# Patient Record
Sex: Female | Born: 1970 | Race: White | Hispanic: No | Marital: Married | Smoking: Former smoker
Health system: Southern US, Community
[De-identification: ages and names within clinical notes are randomized; demographics above are authoritative.]

## PROBLEM LIST (undated history)

## (undated) DIAGNOSIS — E039 Hypothyroidism, unspecified: Secondary | ICD-10-CM

## (undated) DIAGNOSIS — F329 Major depressive disorder, single episode, unspecified: Secondary | ICD-10-CM

## (undated) DIAGNOSIS — M1990 Unspecified osteoarthritis, unspecified site: Secondary | ICD-10-CM

## (undated) DIAGNOSIS — I10 Essential (primary) hypertension: Secondary | ICD-10-CM

## (undated) DIAGNOSIS — F419 Anxiety disorder, unspecified: Secondary | ICD-10-CM

## (undated) DIAGNOSIS — M6289 Other specified disorders of muscle: Secondary | ICD-10-CM

## (undated) DIAGNOSIS — S92343A Displaced fracture of fourth metatarsal bone, unspecified foot, initial encounter for closed fracture: Secondary | ICD-10-CM

## (undated) DIAGNOSIS — A549 Gonococcal infection, unspecified: Secondary | ICD-10-CM

## (undated) DIAGNOSIS — F32A Depression, unspecified: Secondary | ICD-10-CM

## (undated) DIAGNOSIS — S92353A Displaced fracture of fifth metatarsal bone, unspecified foot, initial encounter for closed fracture: Secondary | ICD-10-CM

## (undated) DIAGNOSIS — Z6841 Body Mass Index (BMI) 40.0 and over, adult: Secondary | ICD-10-CM

## (undated) DIAGNOSIS — E282 Polycystic ovarian syndrome: Secondary | ICD-10-CM

## (undated) HISTORY — DX: Displaced fracture of fourth metatarsal bone, unspecified foot, initial encounter for closed fracture: S92.343A

## (undated) HISTORY — DX: Polycystic ovarian syndrome: E28.2

## (undated) HISTORY — PX: WISDOM TOOTH EXTRACTION: SHX21

## (undated) HISTORY — DX: Gonococcal infection, unspecified: A54.9

## (undated) HISTORY — DX: Other specified disorders of muscle: M62.89

## (undated) HISTORY — DX: Body Mass Index (BMI) 40.0 and over, adult: Z684

## (undated) HISTORY — DX: Morbid (severe) obesity due to excess calories: E66.01

## (undated) HISTORY — DX: Displaced fracture of fifth metatarsal bone, unspecified foot, initial encounter for closed fracture: S92.353A

---
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
## 1998-04-19 HISTORY — PX: DILATION AND CURETTAGE OF UTERUS: SHX78

## 2004-02-04 ENCOUNTER — Ambulatory Visit: Payer: Self-pay | Admitting: Family Medicine

## 2004-02-27 ENCOUNTER — Emergency Department: Payer: Self-pay | Admitting: Emergency Medicine

## 2005-10-31 ENCOUNTER — Emergency Department: Payer: Self-pay | Admitting: Emergency Medicine

## 2005-11-19 ENCOUNTER — Ambulatory Visit: Payer: Self-pay

## 2009-01-17 HISTORY — PX: BUNIONECTOMY: SHX129

## 2009-12-11 ENCOUNTER — Emergency Department: Payer: Self-pay | Admitting: Emergency Medicine

## 2012-05-08 ENCOUNTER — Ambulatory Visit: Payer: Self-pay | Admitting: Family Medicine

## 2013-10-24 ENCOUNTER — Emergency Department: Payer: Self-pay | Admitting: Emergency Medicine

## 2013-11-17 DIAGNOSIS — A549 Gonococcal infection, unspecified: Secondary | ICD-10-CM

## 2013-11-17 HISTORY — DX: Gonococcal infection, unspecified: A54.9

## 2015-04-30 ENCOUNTER — Ambulatory Visit
Admission: RE | Admit: 2015-04-30 | Discharge: 2015-04-30 | Disposition: A | Discharge status: Home / Self Care | Payer: BLUE CROSS/BLUE SHIELD | Source: Ambulatory Visit | Attending: Family Medicine | Admitting: Family Medicine

## 2015-04-30 ENCOUNTER — Other Ambulatory Visit: Payer: Self-pay | Admitting: Family Medicine

## 2015-04-30 DIAGNOSIS — M545 Low back pain: Secondary | ICD-10-CM | POA: Insufficient documentation

## 2015-04-30 DIAGNOSIS — M4690 Unspecified inflammatory spondylopathy, site unspecified: Secondary | ICD-10-CM

## 2015-04-30 DIAGNOSIS — R938 Abnormal findings on diagnostic imaging of other specified body structures: Secondary | ICD-10-CM | POA: Diagnosis not present

## 2015-05-08 ENCOUNTER — Other Ambulatory Visit: Payer: Self-pay | Admitting: Family Medicine

## 2015-08-20 ENCOUNTER — Encounter
Admission: RE | Admit: 2015-08-20 | Discharge: 2015-08-20 | Disposition: A | Discharge status: Home / Self Care | Payer: BLUE CROSS/BLUE SHIELD | Source: Ambulatory Visit | Attending: Obstetrics & Gynecology | Admitting: Obstetrics & Gynecology

## 2015-08-20 DIAGNOSIS — Z01812 Encounter for preprocedural laboratory examination: Secondary | ICD-10-CM | POA: Insufficient documentation

## 2015-08-20 HISTORY — DX: Major depressive disorder, single episode, unspecified: F32.9

## 2015-08-20 HISTORY — DX: Depression, unspecified: F32.A

## 2015-08-20 HISTORY — DX: Hypothyroidism, unspecified: E03.9

## 2015-08-20 HISTORY — DX: Essential (primary) hypertension: I10

## 2015-08-20 HISTORY — DX: Anxiety disorder, unspecified: F41.9

## 2015-08-20 HISTORY — DX: Unspecified osteoarthritis, unspecified site: M19.90

## 2015-08-20 LAB — CBC
HCT: 38.1 % (ref 35.0–47.0)
Hemoglobin: 12.7 g/dL (ref 12.0–16.0)
MCH: 27.7 pg (ref 26.0–34.0)
MCHC: 33.4 g/dL (ref 32.0–36.0)
MCV: 82.9 fL (ref 80.0–100.0)
PLATELETS: 254 10*3/uL (ref 150–440)
RBC: 4.6 MIL/uL (ref 3.80–5.20)
RDW: 15.8 % — ABNORMAL HIGH (ref 11.5–14.5)
WBC: 10.6 10*3/uL (ref 3.6–11.0)

## 2015-08-20 LAB — TYPE AND SCREEN
ABO/RH(D): A POS
ANTIBODY SCREEN: NEGATIVE

## 2015-08-20 LAB — ABO/RH: ABO/RH(D): A POS

## 2015-08-20 NOTE — Patient Instructions (Signed)
Your procedure is scheduled on: Friday 512/17 Report to Day Surgery. 2ND FLOOR MEDICAL MALL ENTRANCE To find out your arrival time please call [phone] between 1PM - 3PM on Thursday 08/28/15.  Remember: Instructions that are not followed completely may result in serious medical risk, up to and including death, or upon the discretion of your surgeon and anesthesiologist your surgery may need to be rescheduled.    __X__ 1. Do not eat food or drink liquids after midnight. No gum chewing or hard candies.     __X__ 2. No Alcohol for 24 hours before or after surgery.   ____ 3. Bring all medications with you on the day of surgery if instructed.    __X__ 4. Notify your doctor if there is any change in your medical condition     (cold, fever, infections).     Do not wear jewelry, make-up, hairpins, clips or nail polish.  Do not wear lotions, powders, or perfumes.   Do not shave 48 hours prior to surgery. Men may shave face and neck.  Do not bring valuables to the hospital.    [facility] any belongings or valuables.               Contacts, dentures or bridgework may not be worn into surgery.  Leave your suitcase in the car. After surgery it may be brought to your room.  For patients admitted to the hospital, discharge time is determined by your                treatment team.   Patients discharged the day of surgery will not be allowed to drive home.   Please read over the following fact sheets that you were given:   Surgical Site Infection Prevention   __X__ Take these medicines the morning of surgery with A SIP OF WATER:    1. CELEXA  2.   3.   4.  5.  6.  ____ Fleet Enema (as directed)   ____ Use CHG Soap as directed  ____ Use inhalers on the day of surgery  ____ Stop metformin 2 days prior to surgery    ____ Take 1/2 of usual insulin dose the night before surgery and none on the morning of surgery.   ____ Stop Coumadin/Plavix/aspirin on   ____  Stop Anti-inflammatories on    __X__ Stop supplements until after surgery.  HOLD B COMPLEX UNTIL AFTER SURGERY STARTING FRIDAY  __X__ Bring C-Pap to the hospital.

## 2015-08-20 NOTE — Pre-Procedure Instructions (Signed)
ANESTHESIA - ECG INTERPRETATION   Component Name Value Range  Vent Rate (bpm) 78   PR Interval (msec) 150   QRS Interval (msec) 106   QT Interval (msec) 456   QTc (msec) 519    Result Narrative  Normal sinus rhythm RSR' in V1 or V2 Nonspecific T wave abnormalities Abnormal ECG No previous ECGs available I reviewed and concur with this report. Electronically signed AV:[id number]:ROSENBERG, MD, Renae FicklePAUL [phone]) on 12/06/2014 7:40:15 PM   Status Results Details

## 2015-08-29 ENCOUNTER — Encounter: Payer: Self-pay | Admitting: *Deleted

## 2015-08-29 ENCOUNTER — Encounter
Admission: RE | Disposition: A | Discharge status: Home / Self Care | Payer: Self-pay | Source: Ambulatory Visit | Attending: Obstetrics & Gynecology

## 2015-08-29 ENCOUNTER — Ambulatory Visit: Payer: BLUE CROSS/BLUE SHIELD | Admitting: Anesthesiology

## 2015-08-29 ENCOUNTER — Ambulatory Visit
Admission: RE | Admit: 2015-08-29 | Discharge: 2015-08-29 | Disposition: A | Discharge status: Home / Self Care | Payer: BLUE CROSS/BLUE SHIELD | Source: Ambulatory Visit | Attending: Obstetrics & Gynecology | Admitting: Obstetrics & Gynecology

## 2015-08-29 DIAGNOSIS — E039 Hypothyroidism, unspecified: Secondary | ICD-10-CM | POA: Diagnosis not present

## 2015-08-29 DIAGNOSIS — Z87891 Personal history of nicotine dependence: Secondary | ICD-10-CM | POA: Insufficient documentation

## 2015-08-29 DIAGNOSIS — Z9109 Other allergy status, other than to drugs and biological substances: Secondary | ICD-10-CM | POA: Diagnosis not present

## 2015-08-29 DIAGNOSIS — Z6841 Body Mass Index (BMI) 40.0 and over, adult: Secondary | ICD-10-CM | POA: Insufficient documentation

## 2015-08-29 DIAGNOSIS — M1990 Unspecified osteoarthritis, unspecified site: Secondary | ICD-10-CM | POA: Diagnosis not present

## 2015-08-29 DIAGNOSIS — F329 Major depressive disorder, single episode, unspecified: Secondary | ICD-10-CM | POA: Diagnosis not present

## 2015-08-29 DIAGNOSIS — N921 Excessive and frequent menstruation with irregular cycle: Secondary | ICD-10-CM | POA: Diagnosis present

## 2015-08-29 DIAGNOSIS — Z8042 Family history of malignant neoplasm of prostate: Secondary | ICD-10-CM | POA: Diagnosis not present

## 2015-08-29 DIAGNOSIS — N926 Irregular menstruation, unspecified: Secondary | ICD-10-CM | POA: Diagnosis not present

## 2015-08-29 DIAGNOSIS — Z8249 Family history of ischemic heart disease and other diseases of the circulatory system: Secondary | ICD-10-CM | POA: Insufficient documentation

## 2015-08-29 DIAGNOSIS — Z79899 Other long term (current) drug therapy: Secondary | ICD-10-CM | POA: Insufficient documentation

## 2015-08-29 DIAGNOSIS — N915 Oligomenorrhea, unspecified: Secondary | ICD-10-CM | POA: Diagnosis not present

## 2015-08-29 DIAGNOSIS — Z820 Family history of epilepsy and other diseases of the nervous system: Secondary | ICD-10-CM | POA: Diagnosis not present

## 2015-08-29 DIAGNOSIS — F419 Anxiety disorder, unspecified: Secondary | ICD-10-CM | POA: Insufficient documentation

## 2015-08-29 DIAGNOSIS — Z833 Family history of diabetes mellitus: Secondary | ICD-10-CM | POA: Insufficient documentation

## 2015-08-29 DIAGNOSIS — Z888 Allergy status to other drugs, medicaments and biological substances status: Secondary | ICD-10-CM | POA: Diagnosis not present

## 2015-08-29 HISTORY — PX: HYSTEROSCOPY WITH D & C: SHX1775

## 2015-08-29 LAB — POCT PREGNANCY, URINE: Preg Test, Ur: NEGATIVE

## 2015-08-29 SURGERY — DILATATION AND CURETTAGE /HYSTEROSCOPY
Anesthesia: General | Wound class: Clean Contaminated

## 2015-08-29 MED ORDER — KETOROLAC TROMETHAMINE 30 MG/ML IJ SOLN: 30.0000 mg | Freq: Four times a day (QID) | INTRAMUSCULAR | Status: DC

## 2015-08-29 MED ORDER — ONDANSETRON HCL 4 MG/2ML IJ SOLN
INTRAMUSCULAR | Status: DC | PRN
  Administered 2015-08-29: 4 mg via INTRAVENOUS

## 2015-08-29 MED ORDER — FENTANYL CITRATE (PF) 100 MCG/2ML IJ SOLN: 25.0000 ug | INTRAMUSCULAR | Status: DC | PRN

## 2015-08-29 MED ORDER — ONDANSETRON HCL 4 MG/2ML IJ SOLN: 4.0000 mg | Freq: Once | INTRAMUSCULAR | Status: DC | PRN

## 2015-08-29 MED ORDER — ACETAMINOPHEN 650 MG RE SUPP: 650.0000 mg | RECTAL | Status: DC | PRN

## 2015-08-29 MED ORDER — MORPHINE SULFATE (PF) 2 MG/ML IV SOLN: 1.0000 mg | INTRAVENOUS | Status: DC | PRN

## 2015-08-29 MED ORDER — PROPOFOL 10 MG/ML IV BOLUS
INTRAVENOUS | Status: DC | PRN
  Administered 2015-08-29: 200 mg via INTRAVENOUS

## 2015-08-29 MED ORDER — SILVER NITRATE-POT NITRATE 75-25 % EX MISC
CUTANEOUS | Status: AC
  Filled 2015-08-29: qty 2

## 2015-08-29 MED ORDER — FAMOTIDINE 20 MG PO TABS
ORAL_TABLET | ORAL | Status: AC
  Administered 2015-08-29: 20 mg via ORAL
  Filled 2015-08-29: qty 1

## 2015-08-29 MED ORDER — FENTANYL CITRATE (PF) 100 MCG/2ML IJ SOLN
INTRAMUSCULAR | Status: DC | PRN
  Administered 2015-08-29: 25 ug via INTRAVENOUS
  Administered 2015-08-29: 50 ug via INTRAVENOUS
  Administered 2015-08-29: 25 ug via INTRAVENOUS

## 2015-08-29 MED ORDER — ACETAMINOPHEN 325 MG PO TABS: 650.0000 mg | ORAL_TABLET | ORAL | Status: DC | PRN

## 2015-08-29 MED ORDER — OXYCODONE-ACETAMINOPHEN 5-325 MG PO TABS: 1.0000 | ORAL_TABLET | ORAL | Status: DC | PRN

## 2015-08-29 MED ORDER — KETOROLAC TROMETHAMINE 30 MG/ML IJ SOLN
INTRAMUSCULAR | Status: DC | PRN
  Administered 2015-08-29: 30 mg via INTRAVENOUS

## 2015-08-29 MED ORDER — LACTATED RINGERS IR SOLN
Status: DC | PRN
  Administered 2015-08-29: 700 mL

## 2015-08-29 MED ORDER — SILVER NITRATE-POT NITRATE 75-25 % EX MISC
CUTANEOUS | Status: AC
  Filled 2015-08-29: qty 1

## 2015-08-29 MED ORDER — FAMOTIDINE 20 MG PO TABS
20.0000 mg | ORAL_TABLET | Freq: Once | ORAL | Status: AC
  Administered 2015-08-29: 20 mg via ORAL

## 2015-08-29 MED ORDER — LACTATED RINGERS IV SOLN
INTRAVENOUS | Status: DC
  Administered 2015-08-29: 10:00:00 via INTRAVENOUS

## 2015-08-29 SURGICAL SUPPLY — 24 items
ABLATOR ENDOMETRIAL MYOSURE (ABLATOR) IMPLANT
BAG COUNTER SPONGE EZ (MISCELLANEOUS) IMPLANT
CANISTER SUC SOCK COL 7IN (MISCELLANEOUS) ×3 IMPLANT
CATH ROBINSON RED A/P 16FR (CATHETERS) ×3 IMPLANT
COUNTER SPONGE BAG EZ (MISCELLANEOUS)
ELECT REM PT RETURN 9FT ADLT (ELECTROSURGICAL)
ELECTRODE REM PT RTRN 9FT ADLT (ELECTROSURGICAL) IMPLANT
GLOVE BIO SURGEON STRL SZ8 (GLOVE) ×6 IMPLANT
GOWN STRL REUS W/ TWL LRG LVL3 (GOWN DISPOSABLE) ×1 IMPLANT
GOWN STRL REUS W/ TWL XL LVL3 (GOWN DISPOSABLE) ×1 IMPLANT
GOWN STRL REUS W/TWL LRG LVL3 (GOWN DISPOSABLE) ×2
GOWN STRL REUS W/TWL XL LVL3 (GOWN DISPOSABLE) ×2
IV LACTATED RINGERS 1000ML (IV SOLUTION) ×3 IMPLANT
MYOSURE LITE POLYP REMOVAL (MISCELLANEOUS) IMPLANT
PACK DNC HYST (MISCELLANEOUS) ×3 IMPLANT
PAD OB MATERNITY 4.3X12.25 (PERSONAL CARE ITEMS) ×3 IMPLANT
PAD PREP 24X41 OB/GYN DISP (PERSONAL CARE ITEMS) ×3 IMPLANT
SOL .9 NS 3000ML IRR  AL (IV SOLUTION)
SOL .9 NS 3000ML IRR UROMATIC (IV SOLUTION) IMPLANT
STRAP SAFETY BODY (MISCELLANEOUS) ×3 IMPLANT
TOWEL OR 17X26 4PK STRL BLUE (TOWEL DISPOSABLE) ×3 IMPLANT
TUBING CONNECTING 10 (TUBING) IMPLANT
TUBING CONNECTING 10' (TUBING)
TUBING HYSTEROSCOPY DOLPHIN (MISCELLANEOUS) IMPLANT

## 2015-08-29 NOTE — Anesthesia Preprocedure Evaluation (Addendum)
Anesthesia Evaluation  Patient identified by MRN, date of birth, ID band Patient awake    Reviewed: Allergy & Precautions, H&P , NPO status , Patient's Chart, lab work & pertinent test results, reviewed documented beta blocker date and time   Airway Mallampati: II  TM Distance: >3 FB Neck ROM: full    Dental  (+) Teeth Intact   Pulmonary neg pulmonary ROS, former smoker,    Pulmonary exam normal        Cardiovascular Exercise Tolerance: Good hypertension, negative cardio ROS Normal cardiovascular exam Rate:Normal     Neuro/Psych negative neurological ROS  negative psych ROS   GI/Hepatic negative GI ROS, Neg liver ROS,   Endo/Other  negative endocrine ROSHypothyroidism Morbid obesity  Renal/GU negative Renal ROS  negative genitourinary   Musculoskeletal  (+) Arthritis ,   Abdominal   Peds  Hematology negative hematology ROS (+)   Anesthesia Other Findings   Reproductive/Obstetrics negative OB ROS                            Anesthesia Physical Anesthesia Plan  ASA: II  Anesthesia Plan: General LMA   Post-op Pain Management:    Induction:   Airway Management Planned:   Additional Equipment:   Intra-op Plan:   Post-operative Plan:   Informed Consent: I have reviewed the patients History and Physical, chart, labs and discussed the procedure including the risks, benefits and alternatives for the proposed anesthesia with the patient or authorized representative who has indicated his/her understanding and acceptance.     Plan Discussed with: CRNA  Anesthesia Plan Comments:         Anesthesia Quick Evaluation

## 2015-08-29 NOTE — Transfer of Care (Signed)
Immediate Anesthesia Transfer of Care Note  Patient: Sarah Lozano  Procedure(s) Performed: Procedure(s): DILATATION AND CURETTAGE /HYSTEROSCOPY (N/A)  Patient Location: PACU  Anesthesia Type:General  Level of Consciousness: patient cooperative and lethargic  Airway & Oxygen Therapy: Patient Spontanous Breathing and Patient connected to face mask oxygen  Post-op Assessment: Report given to RN and Post -op Vital signs reviewed and stable  Post vital signs: Reviewed and stable  Last Vitals:  Filed Vitals:   08/29/15 0926  BP: 144/89  Pulse: 77  Temp: 36.8 C  Resp: 20    Last Pain: There were no vitals filed for this visit.       Complications: No apparent anesthesia complications

## 2015-08-29 NOTE — Anesthesia Procedure Notes (Signed)
Procedure Name: LMA Insertion Date/Time: 08/29/2015 10:25 AM Performed by: Omer JackWEATHERLY, Sarah Rosier Pre-anesthesia Checklist: Patient identified, Patient being monitored, Timeout performed, Emergency Drugs available and Suction available Patient Re-evaluated:Patient Re-evaluated prior to inductionOxygen Delivery Method: Circle system utilized Preoxygenation: Pre-oxygenation with 100% oxygen Intubation Type: IV induction Ventilation: Mask ventilation without difficulty LMA: LMA inserted LMA Size: 3.5 Tube type: Oral Number of attempts: 1 Placement Confirmation: positive ETCO2 and breath sounds checked- equal and bilateral Tube secured with: Tape Dental Injury: Teeth and Oropharynx as per pre-operative assessment

## 2015-08-29 NOTE — Op Note (Signed)
Operative Note  08/29/2015  PRE-OP DIAGNOSIS: Irregular Menstrual Bleeding, Oligomenorrhea, Morbid obesity, BMI 50-59  POST-OP DIAGNOSIS: same   SURGEON: Annamarie MajorPaul Harris, MD, FACOG  PROCEDURE: Procedure(s): DILATATION AND CURETTAGE /HYSTEROSCOPY   ANESTHESIA: Choice   ESTIMATED BLOOD LOSS: Min, <20 mL   SPECIMENS:  ECC, EMC  FLUID DEFICIT: Min.  COMPLICATIONS: None  DISPOSITION: PACU - hemodynamically stable.  CONDITION: stable  FINDINGS: Exam under anesthesia revealed small, mobile  uterus with no masses and bilateral adnexa without masses or fullness. Hysteroscopy revealed a  grossly normal appearing uterine cavity with bilateral tubal ostia and normal appearing endocervical canal. No polyps or fibroids.  PROCEDURE IN DETAIL: After informed consent was obtained, the patient was taken to the operating room where anesthesia was obtained without difficulty. The patient was positioned in the dorsal lithotomy position in [place] stirrups. The patient's bladder was catheterized with an in and out foley catheter. The patient was examined under anesthesia, with the above noted findings. The weighted speculum was placed inside the patient's vagina, and the the anterior lip of the cervix was seen and grasped with the tenaculum.  An Endocervical specimen was obtained with a kevorkian curette. The uterine cavity was sounded to 8cm, and then the cervix was progressively dilated to a 20French-Pratt dilator. The 30 degree hysteroscope was introduced, with saline fluid used to distend the intrauterine cavity, with the above noted findings.  The hystersocope was removed and the uterine cavity was curetted until a gritty texture was noted, yielding endometrial curettings. Excellent hemostasis was noted, and all instruments were removed, with excellent hemostasis noted throughout. She was then taken out of dorsal lithotomy. Minimal discrepancy in fluid was noted.  The patient tolerated the procedure well.  Sponge, lap and needle counts were correct x2. The patient was taken to recovery room in excellent condition.

## 2015-08-29 NOTE — H&P (Signed)
History and Physical Interval Note:  08/29/2015 9:41 AM  Sarah MondayBrenda J Lozano  has presented today for surgery, with the diagnosis of IRREGULAR UTERINE BLEEDING  The various methods of treatment have been discussed with the patient and family. After consideration of risks, benefits and other options for treatment, the patient has consented to  Procedure(s): DILATATION AND CURETTAGE /HYSTEROSCOPY (N/A) as a surgical intervention .  The patient's history has been reviewed, patient examined, no change in status, stable for surgery.  Pt has the following beta blocker history-  Not taking Beta Blocker.  I have reviewed the patient's chart and labs.  Questions were answered to the patient's satisfaction.       [name]

## 2015-08-29 NOTE — Discharge Instructions (Signed)
Hysteroscopy, Care After  Refer to this sheet in the next few weeks. These instructions provide you with information on caring for yourself after your procedure. Your health care provider may also give you more specific instructions. Your treatment has been planned according to current medical practices, but problems sometimes occur. Call your health care provider if you have any problems or questions after your procedure.   WHAT TO EXPECT AFTER THE PROCEDURE  After your procedure, it is typical to have the following:  · You may have some cramping. This normally lasts for a couple days.  · You may have bleeding. This can vary from light spotting for a few days to menstrual-like bleeding for 3-7 days.  HOME CARE INSTRUCTIONS  · Rest for the first 1-2 days after the procedure.  · Only take over-the-counter or prescription medicines as directed by your health care provider. Do not take aspirin. It can increase the chances of bleeding.  · Take showers instead of baths for 2 weeks or as directed by your health care provider.  · Do not drive for 24 hours or as directed.  · Do not drink alcohol while taking pain medicine.  · Do not use tampons, douche, or have sexual intercourse for 2 weeks or until your health care provider says it is okay.  · Take your temperature twice a day for 4-5 days. Write it down each time.  · Follow your health care provider's advice about diet, exercise, and lifting.  · If you develop constipation, you may:    Take a mild laxative if your health care provider approves.    Add bran foods to your diet.    Drink enough fluids to keep your urine clear or pale yellow.  · Try to have someone with you or available to you for the first 24-48 hours, especially if you were given a general anesthetic.  · Follow up with your health care provider as directed.  SEEK MEDICAL CARE IF:  · You feel dizzy or lightheaded.  · You feel sick to your stomach (nauseous).  · You have abnormal vaginal discharge.  · You  have a rash.  · You have pain that is not controlled with medicine.  SEEK IMMEDIATE MEDICAL CARE IF:  · You have bleeding that is heavier than a normal menstrual period.  · You have a fever.  · You have increasing cramps or pain, not controlled with medicine.  · You have new belly (abdominal) pain.  · You pass out.  · You have pain in the tops of your shoulders (shoulder strap areas).  · You have shortness of breath.     This information is not intended to replace advice given to you by your health care provider. Make sure you discuss any questions you have with your health care provider.     Document Released: 01/24/2013 Document Reviewed: 01/24/2013  Elsevier Interactive Patient Education ©2016 [employer]

## 2015-09-01 LAB — SURGICAL PATHOLOGY

## 2015-09-01 NOTE — Anesthesia Postprocedure Evaluation (Signed)
Anesthesia Post Note  Patient: Alvira MondayBrenda J Jarquin  Procedure(s) Performed: Procedure(s) (LRB): DILATATION AND CURETTAGE /HYSTEROSCOPY (N/A)  Patient location during evaluation: PACU Anesthesia Type: General Level of consciousness: awake and alert Pain management: pain level controlled Vital Signs Assessment: post-procedure vital signs reviewed and stable Respiratory status: spontaneous breathing, nonlabored ventilation, respiratory function stable and patient connected to nasal cannula oxygen Cardiovascular status: blood pressure returned to baseline and stable Postop Assessment: no signs of nausea or vomiting Anesthetic complications: no    Last Vitals:  Filed Vitals:   08/29/15 1235 08/29/15 1253  BP: 132/81 123/80  Pulse: 73 80  Temp:    Resp: 18 18    Last Pain:  Filed Vitals:   08/29/15 1254  PainSc: Asleep                 Yevette EdwardsJames G Princetta Uplinger

## 2015-12-12 HISTORY — PX: COLONOSCOPY: SHX174

## 2016-03-05 ENCOUNTER — Other Ambulatory Visit: Payer: Self-pay | Admitting: Family Medicine

## 2016-03-05 DIAGNOSIS — Z1231 Encounter for screening mammogram for malignant neoplasm of breast: Secondary | ICD-10-CM

## 2016-03-16 ENCOUNTER — Other Ambulatory Visit (INDEPENDENT_AMBULATORY_CARE_PROVIDER_SITE_OTHER): Payer: Self-pay | Admitting: Vascular Surgery

## 2016-03-16 DIAGNOSIS — I10 Essential (primary) hypertension: Secondary | ICD-10-CM

## 2016-03-18 ENCOUNTER — Ambulatory Visit (INDEPENDENT_AMBULATORY_CARE_PROVIDER_SITE_OTHER): Payer: BLUE CROSS/BLUE SHIELD | Admitting: Vascular Surgery

## 2016-03-18 ENCOUNTER — Ambulatory Visit (INDEPENDENT_AMBULATORY_CARE_PROVIDER_SITE_OTHER): Payer: BLUE CROSS/BLUE SHIELD

## 2016-03-18 ENCOUNTER — Encounter (INDEPENDENT_AMBULATORY_CARE_PROVIDER_SITE_OTHER): Payer: Self-pay | Admitting: Vascular Surgery

## 2016-03-18 VITALS — BP 125/78 | HR 64 | Resp 17 | Ht 69.0 in | Wt 370.0 lb

## 2016-03-18 DIAGNOSIS — I10 Essential (primary) hypertension: Secondary | ICD-10-CM

## 2016-03-18 DIAGNOSIS — I701 Atherosclerosis of renal artery: Secondary | ICD-10-CM

## 2016-03-18 DIAGNOSIS — Z6841 Body Mass Index (BMI) 40.0 and over, adult: Secondary | ICD-10-CM

## 2016-03-22 DIAGNOSIS — I701 Atherosclerosis of renal artery: Secondary | ICD-10-CM | POA: Insufficient documentation

## 2016-03-22 DIAGNOSIS — I10 Essential (primary) hypertension: Secondary | ICD-10-CM | POA: Insufficient documentation

## 2016-03-22 NOTE — Progress Notes (Signed)
Subjective:    Patient ID: Sarah Lozano, female    DOB: [birth year], 45 y.o.   MRN: [id number] Chief Complaint  Patient presents with  . Re-evaluation    6 month renal   Patient presents for a six month renal artery stenosis follow up. She is without complaint. The patient underwent a renal artery duplex exam which was notable no significant renal artery stenosis bilaterally, patent renal artery veins bilaterally - when compared to study in 05-28-15 there has been no change. The patient reports good HTN control and no issues with their kidney function. Her BP today was 125/78.    Review of Systems  Constitutional: Negative.   HENT: Negative.   Eyes: Negative.   Respiratory: Negative.   Cardiovascular: Negative.   Gastrointestinal: Negative.   Endocrine: Negative.   Genitourinary: Negative.   Musculoskeletal: Negative.   Skin: Negative.   Allergic/Immunologic: Negative.   Neurological: Negative.   Hematological: Negative.   Psychiatric/Behavioral: Negative.        Objective:   Physical Exam  Constitutional: She is oriented to person, place, and time. She appears well-developed and well-nourished.  Obese  HENT:  Head: Normocephalic and atraumatic.  Right Ear: External ear normal.  Eyes: Conjunctivae and EOM are normal.  Neck: Normal range of motion.  Cardiovascular: Normal rate, regular rhythm, normal heart sounds and intact distal pulses.   Pulses:      Radial pulses are 2+ on the right side, and 2+ on the left side.       Dorsalis pedis pulses are 2+ on the right side, and 2+ on the left side.       Posterior tibial pulses are 2+ on the right side, and 2+ on the left side.  Pulmonary/Chest: Effort normal and breath sounds normal.  Abdominal: Soft. Bowel sounds are normal.  Musculoskeletal: She exhibits no edema.  Neurological: She is alert and oriented to person, place, and time.  Skin: Skin is warm and dry.  Psychiatric: She has a normal mood and affect. Her behavior  is normal. Judgment and thought content normal.   BP 125/78 (BP Location: Right Arm)   Pulse 64   Resp 17   Ht 5\' 9"  (1.753 m)   Wt (!) 370 lb (167.8 kg)   BMI 54.64 kg/m   Past Medical History:  Diagnosis Date  . Anxiety   . Arthritis   . Depression   . Hypertension   . Hypothyroidism    Social History   Social History  . Marital status: Married    Spouse name: N/A  . Number of children: N/A  . Years of education: N/A   Occupational History  . Not on file.   Social History Main Topics  . Smoking status: Former Smoker    Types: Cigarettes  . Smokeless tobacco: Never Used  . Alcohol use Yes  . Drug use: No  . Sexual activity: Not on file   Other Topics Concern  . Not on file   Social History Narrative  . No narrative on file   Past Surgical History:  Procedure Laterality Date  . BUNIONECTOMY Right   . COSMETIC SURGERY     lip  . DILATION AND CURETTAGE OF UTERUS    . HYSTEROSCOPY W/D&C N/A 08/29/2015   Procedure: DILATATION AND CURETTAGE /HYSTEROSCOPY;  Surgeon: Nadara Mustardobert P Harris, MD;  Location: [facility] ORS;  Service: Gynecology;  Laterality: N/A;  . WISDOM TOOTH EXTRACTION     No family history on file.  Allergies  Allergen Reactions  . Lisinopril Cough  . Metformin And Related Swelling    Swelling of joints   . Vistaril [Hydroxyzine Hcl] Other (See Comments)    "hyper"      Assessment & Plan:  Patient presents for a six month renal artery stenosis follow up. She is without complaint. The patient underwent a renal artery duplex exam which was notable no significant renal artery stenosis bilaterally, patent renal artery veins bilaterally - when compared to study in 05-28-15 there has been no change. The patient reports good HTN control and no issues with their kidney function. Her BP today was 125/78.   1. Essential hypertension - Stable No renal artery stenosis noted on two separate renal duplexes. No renal cause of hypertension. Encouraged good control as  its slows the progression of atherosclerotic disease  2. Renal artery stenosis (HCC) - None No renal artery stenosis noted on two separate renal duplexes. No renal cause of hypertension. I have discussed with the patient at length the risk factors for and pathogenesis of atherosclerotic disease and encouraged a healthy diet, regular exercise regimen and blood pressure / glucose control.  Patient was instructed to contact our office in the interim with problems such as increasing / uncontrollable hypertension or changes in kidney function. The patient expresses their understanding.  3. Morbid obesity with BMI of 50.0-59.9, adult (HCC) - Stable Encouraged weight loss as its slows the progression of atherosclerotic disease and reduces risk factors such as hyperlipidemia and diabetes.   Current Outpatient Prescriptions on File Prior to Visit  Medication Sig Dispense Refill  . b complex vitamins capsule Take 1 capsule by mouth at bedtime.    . Calcium-Magnesium-Vitamin D (CALCIUM 500 PO) Take 1 tablet by mouth daily.    . cetirizine (ZYRTEC) 10 MG tablet Take 10 mg by mouth at bedtime.    . citalopram (CELEXA) 40 MG tablet Take 40 mg by mouth daily.    Marland Kitchen. LORazepam (ATIVAN) 0.5 MG tablet Take 0.5 mg by mouth 3 (three) times daily as needed for anxiety.    Marland Kitchen. losartan (COZAAR) 100 MG tablet Take 100 mg by mouth at bedtime.    . montelukast (SINGULAIR) 10 MG tablet Take 10 mg by mouth at bedtime.     No current facility-administered medications on file prior to visit.     There are no Patient Instructions on file for this visit. No Follow-up on file.   Thelma Viana A Yaritzi Craun, PA-C

## 2016-04-16 ENCOUNTER — Ambulatory Visit
Admission: RE | Admit: 2016-04-16 | Discharge: 2016-04-16 | Disposition: A | Discharge status: Home / Self Care | Payer: BLUE CROSS/BLUE SHIELD | Source: Ambulatory Visit | Attending: Family Medicine | Admitting: Family Medicine

## 2016-04-16 DIAGNOSIS — Z1231 Encounter for screening mammogram for malignant neoplasm of breast: Secondary | ICD-10-CM | POA: Diagnosis not present

## 2016-04-26 ENCOUNTER — Inpatient Hospital Stay
Admission: RE | Admit: 2016-04-26 | Discharge: 2016-04-26 | Disposition: A | Discharge status: Home / Self Care | Payer: Self-pay | Source: Ambulatory Visit | Attending: *Deleted | Admitting: *Deleted

## 2016-04-26 ENCOUNTER — Other Ambulatory Visit: Payer: Self-pay | Admitting: *Deleted

## 2016-04-26 DIAGNOSIS — Z9289 Personal history of other medical treatment: Secondary | ICD-10-CM

## 2016-07-08 ENCOUNTER — Encounter: Payer: Self-pay | Admitting: Certified Nurse Midwife

## 2016-07-08 ENCOUNTER — Ambulatory Visit (INDEPENDENT_AMBULATORY_CARE_PROVIDER_SITE_OTHER): Payer: BLUE CROSS/BLUE SHIELD | Admitting: Certified Nurse Midwife

## 2016-07-08 VITALS — BP 112/78 | HR 65 | Ht 69.0 in | Wt 363.0 lb

## 2016-07-08 DIAGNOSIS — N920 Excessive and frequent menstruation with regular cycle: Secondary | ICD-10-CM | POA: Diagnosis not present

## 2016-07-08 DIAGNOSIS — E282 Polycystic ovarian syndrome: Secondary | ICD-10-CM

## 2016-07-08 MED ORDER — NORETHINDRONE 0.35 MG PO TABS: 1.0000 | ORAL_TABLET | Freq: Every day | ORAL | 7 refills | Status: DC

## 2016-07-13 DIAGNOSIS — N920 Excessive and frequent menstruation with regular cycle: Secondary | ICD-10-CM | POA: Insufficient documentation

## 2016-07-13 NOTE — Progress Notes (Addendum)
HPI:      Ms. Sarah Lozano is a 46 y.o. G1P1001 who presents for a follow up visit regarding her heavy bleeding on the minipill. Sarah Lozano had a hysteroscopy and D&C in May 2017 for oligomenorrhea and thickened endometrial stripe in the setting of morbid obesity and the inability to technically do a EMB in the office. The pathology came back proliferative. She was then begun on Provera cycling every 3 month, but would have extremely heavy and crampy withdrawal bleeding as well as premenstrual bloating and irritability. She was switched to the minipill in October/November 2017 time frame and on the first 2 packs of pills the bleeding continued to be heavy. The flow and the cramping seems to be decreasing on the pills over the last 2 months. Her LMP was 8-12 March and the cramping was relieved with a daily dose of ibuprofen 800 mgm x 2-3 days. The patient would like to remain on the minipill.   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 She has been on a FODMAP diet (off lactose, glutin and fruits) and has seen some improvement. She will be going for pelvic floor training at [facility].She has also been evaluated for Cushings disease by her endocrinologist  and the work up was negative.  PMHx: She  has a past medical history of Anxiety; Arthritis; Depression; Hypertension; Hypothyroidism; Morbid obesity with BMI of 50.0-59.9, adult (HCC); Pelvic floor dysfunction; and Polycystic ovarian syndrome. Also,  has a past surgical history that includes Dilation and curettage of uterus; Bunionectomy (Right); Cosmetic surgery; Wisdom tooth extraction; and Hysteroscopy w/D&C (N/A, 08/29/2015)., family history includes Diabetes in her paternal grandfather; Heart failure in her maternal grandfather, maternal grandmother, and paternal grandfather; Hypertension in her father, maternal grandfather, maternal grandmother, and paternal  grandfather.,  reports that she has quit smoking. Her smoking use included Cigarettes. She has never used smokeless tobacco. She reports that she drinks alcohol. She reports that she does not use drugs.  She has a current medication list which includes the following prescription(s): b complex vitamins, calcium-magnesium-vitamin d, cetirizine, citalopram, cyclobenzaprine, ibuprofen, lorazepam, losartan, montelukast, albuterol, and norethindrone. Also, is allergic to lisinopril; metformin and related; and vistaril [hydroxyzine hcl].   Review of Systems  Constitutional: Negative for chills and fever.  Gastrointestinal: Positive for abdominal pain, blood in stool, constipation and diarrhea. Negative for heartburn, nausea and vomiting.  Genitourinary: Negative for dysuria, frequency, hematuria and urgency.  Skin: Negative for itching and rash.    Objective: BP 112/78   Pulse 65   Ht 5\' 9"  (1.753 m)   Wt (!) 363 lb (164.7 kg)   LMP 06/24/2016   BMI 53.61 kg/m  Physical Exam  Constitutional: She is oriented to person, place, and time. She appears well-developed and well-nourished. No distress.  Musculoskeletal: Normal range of motion.  Neurological: She is alert and oriented to person, place, and time. No cranial nerve deficit.  Skin: Skin is warm and dry.  Psychiatric: She has a normal mood and affect.    ASSESSMENT/PLAN:  Bleeding improved on minipill-will continue current POM. Follow up with me for annual exam in October/ November 2018  Problem List Items Addressed This Visit      Other   Menorrhagia with regular cycle - Primary   Relevant Medications   norethindrone (MICRONOR,CAMILA,ERRIN) 0.35 MG tablet     Farrel Connersolleen Gutierrez, CNM  I have personally reviewed the patient's history,  indication for care, and the plan as formulated by the midwife. I agree with the above elements or have edited them to reflect my assessment and plan. Annamarie Major, MD, Sarah Lozano Ob/Gyn, [facility] 07/13/2016  10:33 PM

## 2016-07-14 ENCOUNTER — Other Ambulatory Visit: Payer: Self-pay | Admitting: Certified Nurse Midwife

## 2016-07-14 DIAGNOSIS — N920 Excessive and frequent menstruation with regular cycle: Secondary | ICD-10-CM

## 2016-07-19 DIAGNOSIS — K589 Irritable bowel syndrome without diarrhea: Secondary | ICD-10-CM | POA: Insufficient documentation

## 2016-07-19 DIAGNOSIS — E282 Polycystic ovarian syndrome: Secondary | ICD-10-CM | POA: Insufficient documentation

## 2016-07-19 DIAGNOSIS — E039 Hypothyroidism, unspecified: Secondary | ICD-10-CM | POA: Insufficient documentation

## 2016-07-19 DIAGNOSIS — F32A Depression, unspecified: Secondary | ICD-10-CM | POA: Insufficient documentation

## 2016-07-19 DIAGNOSIS — F419 Anxiety disorder, unspecified: Secondary | ICD-10-CM

## 2016-07-19 DIAGNOSIS — F329 Major depressive disorder, single episode, unspecified: Secondary | ICD-10-CM | POA: Insufficient documentation

## 2016-09-15 ENCOUNTER — Other Ambulatory Visit: Payer: Self-pay | Admitting: Certified Nurse Midwife

## 2016-09-15 DIAGNOSIS — N920 Excessive and frequent menstruation with regular cycle: Secondary | ICD-10-CM

## 2017-04-04 ENCOUNTER — Other Ambulatory Visit: Payer: Self-pay | Admitting: Certified Nurse Midwife

## 2017-04-04 ENCOUNTER — Encounter: Payer: Self-pay | Admitting: Certified Nurse Midwife

## 2017-04-04 ENCOUNTER — Other Ambulatory Visit: Payer: Self-pay | Admitting: Family Medicine

## 2017-04-04 DIAGNOSIS — N920 Excessive and frequent menstruation with regular cycle: Secondary | ICD-10-CM

## 2017-04-04 DIAGNOSIS — Z1231 Encounter for screening mammogram for malignant neoplasm of breast: Secondary | ICD-10-CM

## 2017-04-29 ENCOUNTER — Ambulatory Visit
Admission: RE | Admit: 2017-04-29 | Discharge: 2017-04-29 | Disposition: A | Discharge status: Home / Self Care | Payer: BLUE CROSS/BLUE SHIELD | Source: Ambulatory Visit | Attending: Family Medicine | Admitting: Family Medicine

## 2017-04-29 DIAGNOSIS — Z1231 Encounter for screening mammogram for malignant neoplasm of breast: Secondary | ICD-10-CM | POA: Diagnosis present

## 2017-05-04 ENCOUNTER — Encounter: Payer: Self-pay | Admitting: Certified Nurse Midwife

## 2017-05-04 ENCOUNTER — Ambulatory Visit (INDEPENDENT_AMBULATORY_CARE_PROVIDER_SITE_OTHER): Payer: BLUE CROSS/BLUE SHIELD | Admitting: Certified Nurse Midwife

## 2017-05-04 VITALS — BP 120/80 | HR 61 | Ht 69.0 in | Wt 346.0 lb

## 2017-05-04 DIAGNOSIS — Z01419 Encounter for gynecological examination (general) (routine) without abnormal findings: Secondary | ICD-10-CM | POA: Diagnosis not present

## 2017-05-04 DIAGNOSIS — Z124 Encounter for screening for malignant neoplasm of cervix: Secondary | ICD-10-CM

## 2017-05-04 DIAGNOSIS — N920 Excessive and frequent menstruation with regular cycle: Secondary | ICD-10-CM

## 2017-05-04 MED ORDER — NORETHINDRONE 0.35 MG PO TABS: 1.0000 | ORAL_TABLET | Freq: Every day | ORAL | 11 refills | Status: DC

## 2017-05-04 NOTE — Progress Notes (Addendum)
Gynecology Annual Exam  PCP: Rolm Gala, MD  Chief Complaint:  Chief Complaint  Patient presents with  . Gynecologic Exam    History of Present Illness:Sarah Lozano is a 47 year old Caucasian/White female , G 1 P 1 0 0 1 , who presents for her annual exam . She is not having any significant gyn problems. She had a D&C and hysteroscopy in May 2017 for oligomenorrhea and thickened EM stripe in the setting of morbid obesity and inability to techniquely do a EMB in the office. She was begun on a progesterone only pill at the time of her last annual. Her menses are monthly, but vary in flow and duration. Some menses are light and last 1-2 days and others are moderate to heavy and last 3-4 days. She reports having some backache with her menses. She has had no intermenstrual spotting.   The patient's past medical history is notable for a history of seasonal allergies, arthritis, hypothyroidism, hypertension, morbid obesity, PCOS, and anxiety..  Since her last annual GYN exam dated 02/06/2016 , she has lost 21#. She is currently taking phentermine and exercising 4 x/week.  She is infrequently sexually active. She is currently using progesterone only pills for contraception.  Her most recent pap smear was obtained 07/17/2015 and was with negative cells and negative HPV DNA.  Her most recent mammogram obtained on 04/29/2017 was normal.  There is no family history of breast cancer.  There is no family history of ovarian cancer.  She had a colonoscopy in 2017 for rectal bleeding. Had a polyp removed. The colonoscopy was not completed due to inadequate prep. She was advised to complete, but has not done so. Was seeing a [facility] doctor in [place]. The patient does do monthly self breast exams.  The patient does not smoke.  The patient does drink alcohol rarely. The patient does not use illegal drugs.  The patient exercises regularly The patient does get adequate calcium in her diet and with  her supplement.  She had a recent cholesterol screen in 2018 that was normal.     Review of Systems: Review of Systems  Constitutional: Positive for weight loss. Negative for chills and fever.  HENT: Negative for congestion, sinus pain and sore throat.   Eyes: Negative for blurred vision and pain.  Respiratory: Negative for hemoptysis, shortness of breath and wheezing.   Cardiovascular: Negative for chest pain, palpitations and leg swelling.  Gastrointestinal: Negative for abdominal pain, blood in stool, diarrhea, heartburn, nausea and vomiting.  Genitourinary: Negative for dysuria, frequency, hematuria and urgency.  Musculoskeletal: Negative for back pain, joint pain and myalgias.  Skin: Negative for itching and rash.  Neurological: Negative for dizziness, tingling and headaches.  Endo/Heme/Allergies: Negative for environmental allergies and polydipsia. Does not bruise/bleed easily.       Negative for hirsutism   Psychiatric/Behavioral: Positive for depression. The patient is not nervous/anxious and does not have insomnia.     Past Medical History:  Past Medical History:  Diagnosis Date  . Anxiety   . Arthritis   . Depression   . Gonorrhea 11/2013  . Hypertension   . Hypothyroidism   . Morbid obesity with BMI of 50.0-59.9, adult (HCC)   . Pelvic floor dysfunction    in complete emptying of bowel  . Polycystic ovarian syndrome    insulin resistent    Past Surgical History:  Past Surgical History:  Procedure Laterality Date  . BUNIONECTOMY Right 01/2009  . COLONOSCOPY  12/12/2015   rectal bleeding, one polyp and internal hemorrhoid  . COSMETIC SURGERY  1975   lip laceration was repaired  . DILATION AND CURETTAGE OF UTERUS  2000  . HYSTEROSCOPY W/D&C N/A 08/29/2015   Procedure: DILATATION AND CURETTAGE /HYSTEROSCOPY;  Surgeon: Nadara Mustardobert P Harris, MD;  Location: [facility] ORS;  Service: Gynecology;  Laterality: N/A;  . WISDOM TOOTH EXTRACTION      Family History:  Family  History  Problem Relation Age of Onset  . Hypertension Father   . Heart failure Maternal Grandmother   . Hypertension Maternal Grandmother   . Alzheimer's disease Maternal Grandmother   . Heart failure Maternal Grandfather   . Hypertension Maternal Grandfather   . Diabetes Paternal Grandfather   . Heart failure Paternal Grandfather   . Hypertension Paternal Grandfather   . Prostate cancer Paternal Grandfather   . Hypertension Paternal Grandmother     Social History:  Social History   Socioeconomic History  . Marital status: Married    Spouse name: Not on file  . Number of children: 1  . Years of education: Not on file  . Highest education level: Master's degree (e.g., MA, MS, MEng, MEd, MSW, MBA)  Social Needs  . Financial resource strain: Not on file  . Food insecurity - worry: Not on file  . Food insecurity - inability: Not on file  . Transportation needs - medical: Not on file  . Transportation needs - non-medical: Not on file  Occupational History  . Occupation: Therapist, [employer]  Tobacco Use  . Smoking status: Former Smoker    Types: Cigarettes    Last attempt to quit: 12/22/2008    Years since quitting: 8.3  . Smokeless tobacco: Never Used  Substance and Sexual Activity  . Alcohol use: Yes    Comment: rare  . Drug use: No  . Sexual activity: Not Currently    Birth control/protection: Pill  Other Topics Concern  . Not on file  Social History Narrative  . Not on file    Allergies:  Allergies  Allergen Reactions  . Lisinopril Cough  . Metformin And Related Swelling    Swelling of joints   . Vistaril [Hydroxyzine Hcl] Other (See Comments)    "hyper"    Medications: Prior to Admission medications   Medication Sig Start Date End Date Taking? Authorizing Provider  albuterol (PROVENTIL HFA;VENTOLIN HFA) 108 (90 Base) MCG/ACT inhaler Inhale into the lungs. 03/26/15 03/25/16  [provider]  b complex vitamins capsule Take 1 capsule by mouth at  bedtime.    [provider]  Calcium-Magnesium-Vitamin D (CALCIUM 500 PO) Take 1 tablet by mouth daily.    [provider]  CAMILA 0.35 MG tablet TAKE 1 TABLET (0.35 MG TOTAL) BY MOUTH DAILY. 04/04/17   Farrel ConnersGutierrez, Neng Albee, CNM  cetirizine (ZYRTEC) 10 MG tablet Take 10 mg by mouth at bedtime.    [provider]  citalopram (CELEXA) 40 MG tablet Take 40 mg by mouth daily.    [provider]  cyclobenzaprine (FLEXERIL) 5 MG tablet Take by mouth.    [provider]  ibuprofen (ADVIL,MOTRIN) 800 MG tablet Take by mouth.    [provider]  LORazepam (ATIVAN) 0.5 MG tablet Take 0.5 mg by mouth 3 (three) times daily as needed for anxiety.    [provider]  losartan (COZAAR) 100 MG tablet Take 100 mg by mouth at bedtime.    [provider]  montelukast (SINGULAIR) 10 MG tablet Take 10 mg  by mouth at bedtime.    [provider]  norethindrone (MICRONOR,CAMILA,ERRIN) 0.35 MG tablet TAKE 1 TABLET BY MOUTH DAILY 09/15/16   Farrel Conners, CNM    Physical Exam Vitals: BP 120/80   Pulse 61   Ht 5\' 9"  (1.753 m)   Wt (!) 346 lb (156.9 kg)   LMP 04/22/2017 (Exact Date)   BMI 51.10 kg/m   General:WF in  NAD HEENT: normocephalic, anicteric Neck: no thyroid enlargement, no palpable nodules, no cervical lymphadenopathy  Pulmonary: No increased work of breathing, CTAB Cardiovascular: RRR, without murmur  Breast: Breast symmetrical, no tenderness, no palpable nodules or masses, no skin or nipple retraction present, no nipple discharge.  No axillary, infraclavicular or supraclavicular lymphadenopathy. Abdomen: Soft, non-tender, obese, non-distended.  Umbilicus without lesions.  No hepatomegaly or masses palpable. No evidence of hernia. Genitourinary:  External: Normal external female genitalia.  Normal urethral meatus, normal Bartholin's and Skene's glands.    Vagina: Normal vaginal mucosa, no evidence of prolapse.     Cervix: unable to visualize or palpate, blind Pap done  Uterus: unable to delineate due to body habitus  Adnexa: No adnexal masses, non-tender  Rectal: deferred  Lymphatic: no evidence of inguinal lymphadenopathy Extremities: no edema, erythema, or tenderness Neurologic: Grossly intact Psychiatric: mood appropriate, affect full     Assessment: 47 y.o. G1P1001 annual gyn exam  Plan:    1) Breast cancer screening - recommend monthly self breast exam and continue annual screening mammograms. Mammogram is up to date.  2) STI screening was offered and declined.  3) Cervical cancer screening - Pap was done.    4) Contraception - Refill of Camila #1/ RF x 11  5) Routine healthcare maintenance including cholesterol and diabetes screening managed by PCP.   Farrel Conners, CNM

## 2017-05-06 ENCOUNTER — Encounter: Payer: Self-pay | Admitting: Certified Nurse Midwife

## 2017-05-06 LAB — IGP,RFX APTIMA HPV ALL PTH: PAP Smear Comment: 0

## 2017-07-15 ENCOUNTER — Other Ambulatory Visit: Payer: Self-pay | Admitting: Certified Nurse Midwife

## 2017-07-15 DIAGNOSIS — N920 Excessive and frequent menstruation with regular cycle: Secondary | ICD-10-CM

## 2017-10-06 DIAGNOSIS — S92353A Displaced fracture of fifth metatarsal bone, unspecified foot, initial encounter for closed fracture: Secondary | ICD-10-CM

## 2017-10-06 HISTORY — DX: Displaced fracture of fifth metatarsal bone, unspecified foot, initial encounter for closed fracture: S92.353A

## 2018-05-16 ENCOUNTER — Other Ambulatory Visit: Payer: Self-pay

## 2018-05-16 ENCOUNTER — Other Ambulatory Visit: Payer: Self-pay | Admitting: Certified Nurse Midwife

## 2018-05-16 DIAGNOSIS — N920 Excessive and frequent menstruation with regular cycle: Secondary | ICD-10-CM

## 2018-05-16 MED ORDER — NORETHINDRONE 0.35 MG PO TABS: 1.0000 | ORAL_TABLET | Freq: Every day | ORAL | 11 refills | Status: DC

## 2018-05-16 NOTE — Telephone Encounter (Signed)
Pt has sched appt for 2/26 and needs refill of bc.  [phone]  Left detailed msg that refill eRx'd.

## 2018-06-12 ENCOUNTER — Other Ambulatory Visit: Payer: Self-pay | Admitting: Family Medicine

## 2018-06-12 DIAGNOSIS — Z1231 Encounter for screening mammogram for malignant neoplasm of breast: Secondary | ICD-10-CM

## 2018-06-13 NOTE — Progress Notes (Signed)
Gynecology Annual Exam  PCP: Rolm Gala, MD  Chief Complaint:  Chief Complaint  Patient presents with  . Gynecologic Exam    Hot Flashes    History of Present Illness:Sarah Lozano is a 48 year old Caucasian/White female , G 1 P 1 0 0 1 , who presents for her annual exam . She has had a change in her bleeding on her progesterone only pill and has started having more hot flushes that last 4-5 minutes. At the time of her last annual exam 05/04/2017, she was having monthly menses that would vary in flow and duration. Some  were light and lasted 1-2 days and others were moderate to heavy and lasted 3-4 days. She now is experiencing only spotting for a day usually at the beginning of the month.  She had a D&C and hysteroscopy in May 2017 for oligomenorrhea and thickened EM stripe in the setting of morbid obesity and inability to techniquely do a EMB in the office. She was begun on a progesterone only pill in 2017.  The patient's past medical history is notable for a history of seasonal allergies, arthritis, hypothyroidism, hypertension, morbid obesity, PCOS, and anxiety/ depression. She takes Celexa (6 years)  and Ativan prn. She sees a therapist, Verdene Rio, in [place].  Since her last annual GYN exam dated  , she has gained back the weight she lost last year after sustaining a fracture in her left foot in June. She was in a cast for 8 weeks then a boot for 8 , and she still has pain in that foot if she lands on it wrong. Has not been able to exercise like she did prior to the injury. She is currently taking phentermine intermittently to help with weight loss. Her current BMI is 54.34 kg/m2   She is sexually active. She is currently using progesterone only pills for contraception.  Her most recent pap smear was obtained 05/04/2017  and was NIL.  Her most recent mammogram obtained on 04/29/17 was normal. Her next is scheduled for 06/26/2018 There is no family history of breast cancer.    There is no family history of ovarian cancer.  She had a colonoscopy in 2017 for rectal bleeding. Had a polyp removed. The colonoscopy was not completed due to inadequate prep. She was advised to complete, but has not done so. Was seeing a [facility] doctor in [place]. The patient does do monthly self breast exams.  The patient does not smoke.  The patient does drink alcohol rarely. The patient does not use illegal drugs.  The patient exercises occasionally The patient does get adequate calcium in her diet and with her supplement.  She had a recent cholesterol screen in 2019 that was normal.     Review of Systems: Review of Systems  Constitutional: Negative for chills, fever and weight loss.       Positive for weight gain  HENT: Positive for congestion. Negative for sinus pain and sore throat.   Eyes: Negative for blurred vision and pain.  Respiratory: Negative for hemoptysis, shortness of breath and wheezing.   Cardiovascular: Negative for chest pain, palpitations and leg swelling.  Gastrointestinal: Negative for abdominal pain, blood in stool, diarrhea, heartburn, nausea and vomiting.  Genitourinary: Negative for dysuria, frequency, hematuria and urgency.  Musculoskeletal: Negative for back pain, joint pain and myalgias.       Left foot pain due to fracture  Skin: Negative for itching and rash.  Neurological: Negative for  dizziness, tingling and headaches.  Endo/Heme/Allergies: Positive for environmental allergies (and sneezing and coughing). Negative for polydipsia. Does not bruise/bleed easily.       Positive for hot flashes   Psychiatric/Behavioral: Positive for depression. The patient is nervous/anxious. The patient does not have insomnia.     Past Medical History:  Past Medical History:  Diagnosis Date  . Anxiety   . Arthritis   . Depression   . Fracture of 4th metatarsal 6/20/209   Left  . Fracture of 5th metatarsal 10/06/2017   Left  . Gonorrhea 11/2013  .  Hypertension   . Hypothyroidism   . Morbid obesity with BMI of 50.0-59.9, adult (HCC)   . Pelvic floor dysfunction    in complete emptying of bowel  . Polycystic ovarian syndrome    insulin resistent    Past Surgical History:  Past Surgical History:  Procedure Laterality Date  . BUNIONECTOMY Right 01/2009  . COLONOSCOPY  12/12/2015   rectal bleeding, one polyp and internal hemorrhoid  . COSMETIC SURGERY  1975   lip laceration was repaired  . DILATION AND CURETTAGE OF UTERUS  2000  . HYSTEROSCOPY W/D&C N/A 08/29/2015   Procedure: DILATATION AND CURETTAGE /HYSTEROSCOPY;  Surgeon: Nadara Mustard, MD;  Location: [facility] ORS;  Service: Gynecology;  Laterality: N/A;  . WISDOM TOOTH EXTRACTION      Family History:  Family History  Problem Relation Age of Onset  . Hypertension Father   . Prostate cancer Father 66  . Heart failure Maternal Grandmother   . Hypertension Maternal Grandmother   . Alzheimer's disease Maternal Grandmother   . Heart failure Maternal Grandfather   . Hypertension Maternal Grandfather   . Diabetes Paternal Grandfather   . Heart failure Paternal Grandfather   . Hypertension Paternal Grandfather   . Prostate cancer Paternal Grandfather   . Hypertension Paternal Grandmother     Social History:  Social History   Socioeconomic History  . Marital status: Married    Spouse name: Not on file  . Number of children: 1  . Years of education: Not on file  . Highest education level: Master's degree (e.g., MA, MS, MEng, MEd, MSW, MBA)  Occupational History  . Occupation: Therapist, sports  Social Needs  . Financial resource strain: Not on file  . Food insecurity:    Worry: Not on file    Inability: Not on file  . Transportation needs:    Medical: Not on file    Non-medical: Not on file  Tobacco Use  . Smoking status: Former Smoker    Types: Cigarettes    Last attempt to quit: 12/22/2008    Years since quitting: 9.5  . Smokeless tobacco: Never Used   Substance and Sexual Activity  . Alcohol use: Yes    Comment: rare  . Drug use: No  . Sexual activity: Yes    Birth control/protection: Pill  Lifestyle  . Physical activity:    Days per week: 0 days    Minutes per session: 0 min  . Stress: To some extent  Relationships  . Social connections:    Talks on phone: More than three times a week    Gets together: Twice a week    Attends religious service: More than 4 times per year    Active member of club or organization: No    Attends meetings of clubs or organizations: Never    Relationship status: Married  . Intimate partner violence:    Fear of current or  ex partner: No    Emotionally abused: No    Physically abused: No    Forced sexual activity: No  Other Topics Concern  . Not on file  Social History Narrative  . Not on file    Allergies:  Allergies  Allergen Reactions  . Lisinopril Cough  . Metformin And Related Swelling    Swelling of joints   . Vistaril [Hydroxyzine Hcl] Other (See Comments)    "hyper"    Medications: Prior to Admission medications   Medication Sig Start Date End Date Taking? Authorizing Provider  albuterol (PROVENTIL HFA;VENTOLIN HFA) 108 (90 Base) MCG/ACT inhaler Inhale into the lungs. 03/26/15 03/25/16  [provider]  b complex vitamins capsule Take 1 capsule by mouth at bedtime.    [provider]  Calcium-Magnesium-Vitamin D (CALCIUM 500 PO) Take 1 tablet by mouth daily.    [provider]  CAMILA 0.35 MG tablet TAKE 1 TABLET (0.35 MG TOTAL) BY MOUTH DAILY. 04/04/17   Farrel ConnersGutierrez, Tanayia Wahlquist, CNM  cetirizine (ZYRTEC) 10 MG tablet Take 10 mg by mouth at bedtime.    [provider]  citalopram (CELEXA) 40 MG tablet Take 40 mg by mouth daily.    [provider]  cyclobenzaprine (FLEXERIL) 5 MG tablet Take by mouth.    [provider]  ibuprofen (ADVIL,MOTRIN) 800 MG tablet Take by mouth.    [provider]  LORazepam (ATIVAN) 0.5 MG  tablet Take 0.5 mg by mouth 3 (three) times daily as needed for anxiety.    [provider]  losartan (COZAAR) 100 MG tablet Take 100 mg by mouth at bedtime.    [provider]  montelukast (SINGULAIR) 10 MG tablet Take 10 mg by mouth at bedtime.    [provider]           Physical Exam Vitals: BP 118/68 (BP Location: Right Arm, Patient Position: Sitting, Cuff Size: Normal)   Pulse 94   Ht 5\' 9"  (1.753 m)   Wt (!) 368 lb (166.9 kg)   LMP 12/24/2017 (Approximate)   BMI 54.34 kg/m   General:WF in  NAD HEENT: normocephalic, anicteric Neck: no thyroid enlargement, no palpable nodules, no cervical lymphadenopathy  Pulmonary: No increased work of breathing, CTAB Cardiovascular: RRR, without murmur  Breast: Breast symmetrical, no tenderness, no palpable nodules or masses, no skin or nipple retraction present, no nipple discharge.  No axillary, infraclavicular or supraclavicular lymphadenopathy. Abdomen: Soft, non-tender, truncal obesity, non-distended.  Umbilicus without lesions.  No hepatomegaly or masses palpable. No evidence of hernia. Genitourinary:  External: Normal external female genitalia.  Normal urethral meatus, normal Bartholin's and Skene's glands.    Vagina: Normal vaginal mucosa, no evidence of prolapse.    Cervix: unable to visualize or palpate, blind Pap done  Uterus: unable to delineate due to body habitus  Adnexa: No adnexal masses, non-tender  Rectal: deferred  Lymphatic: no evidence of inguinal lymphadenopathy Extremities: no edema, erythema, or tenderness Neurologic: Grossly intact Psychiatric: mood appropriate, affect full     Assessment: 48 y.o. G1P1001 annual gyn exam Hot flashes and oligomenorrhea: perimenopausal?  FSH and LH to be done when off her pills x 1 week.  Needs to use back up birth control until back on pills x 1 week Morbid obesity  Plan:    1) Breast cancer screening - recommend monthly self breast exam and  continue annual screening mammograms. Mammogram is scheduled  2) STI screening was offered and accepted-GC and Chlamydia ordered  3) Cervical cancer  screening - Pap was done.    4) Contraception - Refill of Camila #1/ RF x 11  5) Routine healthcare maintenance including cholesterol and diabetes screening managed by PCP.   6) RTO in 1 year and prn.  Farrel Conners, CNM

## 2018-06-14 ENCOUNTER — Ambulatory Visit (INDEPENDENT_AMBULATORY_CARE_PROVIDER_SITE_OTHER): Payer: BLUE CROSS/BLUE SHIELD | Admitting: Certified Nurse Midwife

## 2018-06-14 ENCOUNTER — Other Ambulatory Visit: Payer: Self-pay

## 2018-06-14 ENCOUNTER — Encounter: Payer: Self-pay | Admitting: Certified Nurse Midwife

## 2018-06-14 ENCOUNTER — Other Ambulatory Visit (HOSPITAL_COMMUNITY)
Admission: RE | Admit: 2018-06-14 | Discharge: 2018-06-14 | Disposition: A | Discharge status: Home / Self Care | Payer: BLUE CROSS/BLUE SHIELD | Source: Ambulatory Visit | Attending: Certified Nurse Midwife | Admitting: Certified Nurse Midwife

## 2018-06-14 VITALS — BP 118/68 | HR 94 | Ht 69.0 in | Wt 368.0 lb

## 2018-06-14 DIAGNOSIS — Z113 Encounter for screening for infections with a predominantly sexual mode of transmission: Secondary | ICD-10-CM

## 2018-06-14 DIAGNOSIS — Z124 Encounter for screening for malignant neoplasm of cervix: Secondary | ICD-10-CM | POA: Diagnosis present

## 2018-06-14 DIAGNOSIS — N951 Menopausal and female climacteric states: Secondary | ICD-10-CM

## 2018-06-14 DIAGNOSIS — Z01419 Encounter for gynecological examination (general) (routine) without abnormal findings: Secondary | ICD-10-CM

## 2018-06-14 DIAGNOSIS — Z3202 Encounter for pregnancy test, result negative: Secondary | ICD-10-CM | POA: Diagnosis not present

## 2018-06-14 DIAGNOSIS — N914 Secondary oligomenorrhea: Secondary | ICD-10-CM

## 2018-06-14 LAB — POCT URINE PREGNANCY: Preg Test, Ur: NEGATIVE

## 2018-06-19 LAB — CYTOLOGY - PAP
CHLAMYDIA, DNA PROBE: NEGATIVE
DIAGNOSIS: NEGATIVE
HPV: NOT DETECTED
NEISSERIA GONORRHEA: NEGATIVE

## 2018-06-23 ENCOUNTER — Other Ambulatory Visit: Payer: BLUE CROSS/BLUE SHIELD

## 2018-06-23 DIAGNOSIS — N914 Secondary oligomenorrhea: Secondary | ICD-10-CM

## 2018-06-23 DIAGNOSIS — N951 Menopausal and female climacteric states: Secondary | ICD-10-CM

## 2018-06-24 LAB — FSH/LH
FSH: 3.9 m[IU]/mL
LH: 3.2 m[IU]/mL

## 2018-06-26 ENCOUNTER — Ambulatory Visit
Admission: RE | Admit: 2018-06-26 | Discharge: 2018-06-26 | Disposition: A | Discharge status: Home / Self Care | Payer: BLUE CROSS/BLUE SHIELD | Source: Ambulatory Visit | Attending: Family Medicine | Admitting: Family Medicine

## 2018-06-26 DIAGNOSIS — Z1231 Encounter for screening mammogram for malignant neoplasm of breast: Secondary | ICD-10-CM

## 2019-01-25 HISTORY — PX: COLONOSCOPY W/ BIOPSIES: SHX1374

## 2019-03-27 ENCOUNTER — Other Ambulatory Visit: Payer: Self-pay | Admitting: Certified Nurse Midwife

## 2019-03-27 DIAGNOSIS — N920 Excessive and frequent menstruation with regular cycle: Secondary | ICD-10-CM

## 2019-04-04 ENCOUNTER — Other Ambulatory Visit: Payer: Self-pay | Admitting: Certified Nurse Midwife

## 2019-04-04 DIAGNOSIS — N920 Excessive and frequent menstruation with regular cycle: Secondary | ICD-10-CM

## 2019-04-04 MED ORDER — NORETHINDRONE 0.35 MG PO TABS: 1.0000 | ORAL_TABLET | Freq: Every day | ORAL | 1 refills | Status: DC

## 2019-04-04 NOTE — Addendum Note (Signed)
Addended by: Cleophas Dunker D on: 04/04/2019 03:47 PM   Modules accepted: Orders

## 2019-06-18 ENCOUNTER — Other Ambulatory Visit: Payer: Self-pay | Admitting: Certified Nurse Midwife

## 2019-06-18 DIAGNOSIS — N920 Excessive and frequent menstruation with regular cycle: Secondary | ICD-10-CM

## 2019-06-21 ENCOUNTER — Ambulatory Visit: Payer: BLUE CROSS/BLUE SHIELD | Admitting: Certified Nurse Midwife

## 2019-07-11 NOTE — Progress Notes (Signed)
Gynecology Annual Exam  PCP: Rolm Gala, MD  Chief Complaint:  Chief Complaint  Patient presents with  . Gynecologic Exam    History of Present Illness:Sarah Lozano is a 49 year old Caucasian/White female , G 1 P 1 0 0 1 , who presents for her annual exam .  Her menses have been monthly and light lasting 1-2 days, except for her last menses 07/07/2019, which was heavier in flow sometimes requiring pad changes every 2 hours. She is still bleeding on day 6. She had cramping with this last menses.  ]  She had a D&C and hysteroscopy in May 2017 for oligomenorrhea and thickened EM stripe in the setting of morbid obesity and inability to techniquely do a EMB in the office. She was begun on a progesterone only pill in 2017.   The patient's past medical history is notable for a history of seasonal allergies, arthritis, hypothyroidism, hypertension, morbid obesity, PCOS, and anxiety/ depression. She takes Celexa (7-8 years)  and Ativan prn. She sees a therapist, Verdene Rio, in [place].  Since her last annual GYN exam dated 06/14/18 , she had gained about 40# and at one point weighed 411#. 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 She is dieting (1800 calorie diet) and exercising sometimes twice a day and has lost over 60#. She is currently taking phentermine intermittently to help with weight loss. Her current BMI is 51.81 kg/m2   She is sexually active. She is currently using progesterone only pills for contraception.  Her most recent pap smear was obtained 06/14/2018  and was NIL/neg HRHPV  Her most recent mammogram obtained on 06/26/2018 was normal. There is no family history of breast cancer.  There is no family history of ovarian cancer.  She had a colonoscopy in 2017 for rectal bleeding. Had a polyp removed. The colonoscopy was not completed due to inadequate prep. She had another colonoscopy 01/25/2019 by Dr Sheppard Penton at [facility] and  had both hyperplastic and adenomatous polyps removed. She will need another colonoscopy in 5 years. The patient does do monthly self breast exams.  The patient does not smoke.  The patient does drink alcohol rarely. The patient does not use illegal drugs.  The patient exercises regularly, sometimes twice a day and has a PT. The patient does get adequate calcium in her diet and with her supplement.  She had a recent cholesterol screen in 2020 that was normal.     Review of Systems: Review of Systems  Constitutional: Positive for weight loss (intentional). Negative for chills and fever.  HENT: Negative for congestion, sinus pain and sore throat.   Eyes: Negative for blurred vision and pain.       Positive for change in vision  Respiratory: Negative for hemoptysis, shortness of breath and wheezing.   Cardiovascular: Negative for chest pain, palpitations and leg swelling.  Gastrointestinal: Negative for abdominal pain, blood in stool, diarrhea, heartburn, nausea and vomiting.  Genitourinary: Negative for dysuria, frequency, hematuria and urgency.  Musculoskeletal: Negative for back pain, joint pain and myalgias.       Left foot pain due to fracture  Skin: Negative for itching and rash.  Neurological: Negative for dizziness, tingling and headaches.  Endo/Heme/Allergies: Positive for environmental allergies (and sneezing and coughing). Negative for polydipsia. Bruises/bleeds easily.       Positive for hot flashes   Psychiatric/Behavioral: Positive for depression. The patient is nervous/anxious. The patient does  not have insomnia.     Past Medical History:  Past Medical History:  Diagnosis Date  . Anxiety   . Arthritis   . Depression   . Fracture of 4th metatarsal 6/20/209   Left  . Fracture of 5th metatarsal 10/06/2017   Left  . Gonorrhea 11/2013  . Hypertension   . Hypothyroidism   . Morbid obesity with BMI of 50.0-59.9, adult (HCC)   . Pelvic floor dysfunction    in complete  emptying of bowel  . Polycystic ovarian syndrome    insulin resistent    Past Surgical History:  Past Surgical History:  Procedure Laterality Date  . BUNIONECTOMY Right 01/2009  . COLONOSCOPY  12/12/2015   rectal bleeding, one polyp and internal hemorrhoid  . COLONOSCOPY W/ BIOPSIES  01/25/2019   hyperplastic and adenomatous polyps removed-next colonoscopy due in 5 years  . COSMETIC SURGERY  1975   lip laceration was repaired  . DILATION AND CURETTAGE OF UTERUS  2000  . HYSTEROSCOPY WITH D & C N/A 08/29/2015   Procedure: DILATATION AND CURETTAGE /HYSTEROSCOPY;  Surgeon: Nadara Mustard, MD;  Location: [facility] ORS;  Service: Gynecology;  Laterality: N/A;  . WISDOM TOOTH EXTRACTION      Family History:  Family History  Problem Relation Age of Onset  . Hypertension Father   . Prostate cancer Father 49  . Heart failure Maternal Grandmother   . Hypertension Maternal Grandmother   . Alzheimer's disease Maternal Grandmother   . Heart failure Maternal Grandfather   . Hypertension Maternal Grandfather   . Diabetes Paternal Grandfather   . Heart failure Paternal Grandfather   . Hypertension Paternal Grandfather   . Prostate cancer Paternal Grandfather   . Hypertension Paternal Grandmother     Social History:  Social History   Socioeconomic History  . Marital status: Married    Spouse name: Not on file  . Number of children: 1  . Years of education: Not on file  . Highest education level: Master's degree (e.g., MA, MS, MEng, MEd, MSW, MBA)  Occupational History  . Occupation: Therapist, sports  Tobacco Use  . Smoking status: Former Smoker    Types: Cigarettes    Quit date: 12/22/2008    Years since quitting: 10.5  . Smokeless tobacco: Never Used  Substance and Sexual Activity  . Alcohol use: Yes    Comment: rare  . Drug use: No  . Sexual activity: Yes    Birth control/protection: Pill  Other Topics Concern  . Not on file  Social History Narrative  . Not on file    Social Determinants of Health   Financial Resource Strain:   . Difficulty of Paying Living Expenses:   Food Insecurity:   . Worried About Programme researcher, broadcasting/film/video in the Last Year:   . Barista in the Last Year:   Transportation Needs:   . Freight forwarder (Medical):   Marland Kitchen Lack of Transportation (Non-Medical):   Physical Activity:   . Days of Exercise per Week:   . Minutes of Exercise per Session:   Stress:   . Feeling of Stress :   Social Connections:   . Frequency of Communication with Friends and Family:   . Frequency of Social Gatherings with Friends and Family:   . Attends Religious Services:   . Active Member of Clubs or Organizations:   . Attends Banker Meetings:   Marland Kitchen Marital Status:   Intimate Partner Violence:   . Fear  of Current or Ex-Partner:   . Emotionally Abused:   Marland Kitchen Physically Abused:   . Sexually Abused:     Allergies:  Allergies  Allergen Reactions  . Lisinopril Cough  . Metformin And Related Swelling    Swelling of joints   . Vistaril [Hydroxyzine Hcl] Other (See Comments)    "hyper"    Medications: Prior to Admission medications   Medication Sig Start Date End Date Taking? Authorizing Provider  albuterol (PROVENTIL HFA;VENTOLIN HFA) 108 (90 Base) MCG/ACT inhaler Inhale into the lungs. 03/26/15 03/25/16  [provider]  b complex vitamins capsule Take 1 capsule by mouth at bedtime.    [provider]  Calcium-Magnesium-Vitamin D (CALCIUM 500 PO) Take 1 tablet by mouth daily.    [provider]  CAMILA 0.35 MG tablet TAKE 1 TABLET (0.35 MG TOTAL) BY MOUTH DAILY. 04/04/17   Dalia Heading, CNM  cetirizine (ZYRTEC) 10 MG tablet Take 10 mg by mouth at bedtime.    [provider]  citalopram (CELEXA) 40 MG tablet Take 40 mg by mouth daily.    [provider]  cyclobenzaprine (FLEXERIL) 5 MG tablet Take by mouth.    [provider]  ibuprofen (ADVIL,MOTRIN) 800 MG tablet Take  by mouth.    [provider]  LORazepam (ATIVAN) 0.5 MG tablet Take 0.5 mg by mouth 3 (three) times daily as needed for anxiety.    [provider]  losartan (COZAAR) 100 MG tablet Take 100 mg by mouth at bedtime.    [provider]  montelukast (SINGULAIR) 10 MG tablet Take 10 mg by mouth at bedtime.    [provider]           Physical Exam Vitals: BP 120/76   Pulse 72   Ht 5\' 9"  (1.753 m)   Wt (!) 351 lb (159.2 kg)   LMP 07/07/2019 (Exact Date)   BMI 51.83 kg/m   General:WF in  NAD HEENT: normocephalic, anicteric Neck: no thyroid enlargement, no palpable nodules, no cervical lymphadenopathy  Pulmonary: No increased work of breathing, CTAB Cardiovascular: RRR, without murmur  Breast: Breast symmetrical, no tenderness, no palpable nodules or masses, no skin or nipple retraction present, no nipple discharge.  No axillary, infraclavicular or supraclavicular lymphadenopathy. Abdomen: Soft, non-tender, truncal obesity, non-distended.  Umbilicus without lesions.  No hepatomegaly or masses palpable. No evidence of hernia. Genitourinary:  External: Normal external female genitalia.  Normal urethral meatus, normal Bartholin's and Skene's glands.    Vagina: Normal vaginal mucosa, no evidence of prolapse.    Cervix: unable to visualize or palpate  Uterus: unable to delineate due to body habitus  Adnexa: No adnexal masses, non-tender  Rectal: deferred  Lymphatic: no evidence of inguinal lymphadenopathy Extremities: no edema, erythema, or tenderness Neurologic: Grossly intact Psychiatric: mood appropriate, affect full     Assessment: 49 y.o. G1P1001 annual gyn exam Morbid obesity  Plan:    1) Breast cancer screening - recommend monthly self breast exam and continue annual screening mammograms. Mammogram is ordered. Patient to schedule  2) Cervical cancer screening - Pap was not  done. Desires Pap smears every 3 years   3) Contraception -  refill of norethindrone 0.35 mgm #3 RF x 3  4) Routine healthcare maintenance including cholesterol and diabetes screening managed by PCP.   5) Colon cancer screening: Colonoscopy UTD. Next due in 2025  5) RTO in 1 year and prn.  Dalia Heading, CNM

## 2019-07-12 ENCOUNTER — Encounter: Payer: Self-pay | Admitting: Certified Nurse Midwife

## 2019-07-12 ENCOUNTER — Other Ambulatory Visit: Payer: Self-pay

## 2019-07-12 ENCOUNTER — Ambulatory Visit (INDEPENDENT_AMBULATORY_CARE_PROVIDER_SITE_OTHER): Payer: 59 | Admitting: Certified Nurse Midwife

## 2019-07-12 VITALS — BP 120/76 | HR 72 | Ht 69.0 in | Wt 351.0 lb

## 2019-07-12 DIAGNOSIS — Z6841 Body Mass Index (BMI) 40.0 and over, adult: Secondary | ICD-10-CM

## 2019-07-12 DIAGNOSIS — Z3041 Encounter for surveillance of contraceptive pills: Secondary | ICD-10-CM

## 2019-07-12 DIAGNOSIS — Z01419 Encounter for gynecological examination (general) (routine) without abnormal findings: Secondary | ICD-10-CM

## 2019-07-12 DIAGNOSIS — Z1239 Encounter for other screening for malignant neoplasm of breast: Secondary | ICD-10-CM

## 2019-07-12 MED ORDER — NORETHINDRONE 0.35 MG PO TABS: 1.0000 | ORAL_TABLET | Freq: Every day | ORAL | 3 refills | Status: DC

## 2019-07-15 ENCOUNTER — Encounter: Payer: Self-pay | Admitting: Certified Nurse Midwife

## 2019-08-02 ENCOUNTER — Ambulatory Visit
Admission: RE | Admit: 2019-08-02 | Discharge: 2019-08-02 | Disposition: A | Discharge status: Home / Self Care | Payer: 59 | Source: Ambulatory Visit | Attending: Certified Nurse Midwife | Admitting: Certified Nurse Midwife

## 2019-08-02 DIAGNOSIS — Z1231 Encounter for screening mammogram for malignant neoplasm of breast: Secondary | ICD-10-CM | POA: Insufficient documentation

## 2019-08-02 DIAGNOSIS — Z1239 Encounter for other screening for malignant neoplasm of breast: Secondary | ICD-10-CM

## 2020-07-14 ENCOUNTER — Ambulatory Visit (INDEPENDENT_AMBULATORY_CARE_PROVIDER_SITE_OTHER): Payer: No Typology Code available for payment source | Admitting: Obstetrics and Gynecology

## 2020-07-14 ENCOUNTER — Encounter: Payer: Self-pay | Admitting: Obstetrics and Gynecology

## 2020-07-14 ENCOUNTER — Other Ambulatory Visit: Payer: Self-pay

## 2020-07-14 VITALS — BP 132/72 | Ht 70.8 in | Wt 328.4 lb

## 2020-07-14 DIAGNOSIS — Z01419 Encounter for gynecological examination (general) (routine) without abnormal findings: Secondary | ICD-10-CM

## 2020-07-14 DIAGNOSIS — N852 Hypertrophy of uterus: Secondary | ICD-10-CM

## 2020-07-14 DIAGNOSIS — Z0000 Encounter for general adult medical examination without abnormal findings: Secondary | ICD-10-CM

## 2020-07-14 DIAGNOSIS — Z1239 Encounter for other screening for malignant neoplasm of breast: Secondary | ICD-10-CM

## 2020-07-14 DIAGNOSIS — Z124 Encounter for screening for malignant neoplasm of cervix: Secondary | ICD-10-CM

## 2020-07-14 DIAGNOSIS — Z1231 Encounter for screening mammogram for malignant neoplasm of breast: Secondary | ICD-10-CM

## 2020-07-14 DIAGNOSIS — Z3041 Encounter for surveillance of contraceptive pills: Secondary | ICD-10-CM

## 2020-07-14 DIAGNOSIS — Z1211 Encounter for screening for malignant neoplasm of colon: Secondary | ICD-10-CM

## 2020-07-14 MED ORDER — NORETHINDRONE 0.35 MG PO TABS
1.0000 | ORAL_TABLET | Freq: Every day | ORAL | 4 refills | Status: DC
Start: 2020-07-14 — End: 2020-07-16

## 2020-07-14 NOTE — Progress Notes (Signed)
Gynecology Annual Exam  PCP: Rolm Gala, MD  Chief Complaint: No chief complaint on file.   History of Present Illness: Patient is a 50 y.o. G1P1001 presents for annual exam. The patient has no complaints today.   LMP: Patient's last menstrual period was 06/30/2020. Average Interval:monthly Duration of flow: 3 days Heavy Menses: no Intermenstrual Bleeding: no Dysmenorrhea: some cramping and back pain  The patient does perform self breast exams.  There is no notable family history of breast or ovarian cancer in her family.  The patient has regular exercise: yes cardio and personal training 7 days a week.  The patient reports current symptoms of depression and anxiety. She see a therapist and take celexa. Reports that symptoms are controlled.    PHQ-9: 6 GAD-7: 6   Review of Systems: Review of Systems  Constitutional: Positive for malaise/fatigue. Negative for chills, fever and weight loss.  HENT: Negative for congestion, hearing loss and sinus pain.   Eyes: Negative for blurred vision and double vision.  Respiratory: Negative for cough, sputum production, shortness of breath and wheezing.   Cardiovascular: Negative for chest pain, palpitations, orthopnea and leg swelling.  Gastrointestinal: Negative for abdominal pain, constipation, diarrhea, nausea and vomiting.  Genitourinary: Negative for dysuria, flank pain, frequency, hematuria and urgency.  Musculoskeletal: Positive for joint pain. Negative for back pain and falls.  Skin: Negative for itching and rash.  Neurological: Negative for dizziness and headaches.  Psychiatric/Behavioral: Positive for depression. Negative for substance abuse and suicidal ideas. The patient is not nervous/anxious.     Past Medical History:  Past Medical History:  Diagnosis Date  . Anxiety   . Arthritis   . Depression   . Fracture of 4th metatarsal 6/20/209   Left  . Fracture of 5th metatarsal 10/06/2017   Left  . Gonorrhea 11/2013   . Hypertension   . Hypothyroidism   . Morbid obesity with BMI of 50.0-59.9, adult (HCC)   . Pelvic floor dysfunction    in complete emptying of bowel  . Polycystic ovarian syndrome    insulin resistent    Past Surgical History:  Past Surgical History:  Procedure Laterality Date  . BUNIONECTOMY Right 01/2009  . COLONOSCOPY  12/12/2015   rectal bleeding, one polyp and internal hemorrhoid  . COLONOSCOPY W/ BIOPSIES  01/25/2019   hyperplastic and adenomatous polyps removed-next colonoscopy due in 5 years  . COSMETIC SURGERY  1975   lip laceration was repaired  . DILATION AND CURETTAGE OF UTERUS  2000  . HYSTEROSCOPY WITH D & C N/A 08/29/2015   Procedure: DILATATION AND CURETTAGE /HYSTEROSCOPY;  Surgeon: Nadara Mustard, MD;  Location: [facility] ORS;  Service: Gynecology;  Laterality: N/A;  . WISDOM TOOTH EXTRACTION      Gynecologic History:  Patient's last menstrual period was 06/30/2020. Menarche: 14  History of fibroids, polyps, or ovarian cysts? : yes, history of ovarain cysts  History of PCOS? yes Hstory of Endometriosis? no History of abnormal pap smears? no Have you had any sexually transmitted infections in the past?  Yes, remote history of chlamydia  Last Pap: Results were: 06/14/2018 NIL and HR HPV negative   She identifies as a female. She is not sexually active with men.   She denies dyspareunia. She denies postcoital bleeding.    Obstetric History: G1P1001  Family History:  Family History  Problem Relation Age of Onset  . Hypertension Father   . Prostate cancer Father 42  . Heart failure Maternal Grandmother   .  Hypertension Maternal Grandmother   . Alzheimer's disease Maternal Grandmother   . Heart failure Maternal Grandfather   . Hypertension Maternal Grandfather   . Diabetes Paternal Grandfather   . Heart failure Paternal Grandfather   . Hypertension Paternal Grandfather   . Prostate cancer Paternal Grandfather   . Hypertension Paternal Grandmother      Social History:  Social History   Socioeconomic History  . Marital status: Married    Spouse name: Not on file  . Number of children: 1  . Years of education: Not on file  . Highest education level: Master's degree (e.g., MA, MS, MEng, MEd, MSW, MBA)  Occupational History  . Occupation: Therapist, sports  Tobacco Use  . Smoking status: Former Smoker    Types: Cigarettes    Quit date: 12/22/2008    Years since quitting: 11.5  . Smokeless tobacco: Never Used  Vaping Use  . Vaping Use: Never used  Substance and Sexual Activity  . Alcohol use: Yes    Comment: rare  . Drug use: No  . Sexual activity: Yes    Birth control/protection: Pill  Other Topics Concern  . Not on file  Social History Narrative  . Not on file   Social Determinants of Health   Financial Resource Strain: Not on file  Food Insecurity: Not on file  Transportation Needs: Not on file  Physical Activity: Not on file  Stress: Not on file  Social Connections: Not on file  Intimate Partner Violence: Not on file    Allergies:  Allergies  Allergen Reactions  . Lisinopril Cough  . Metformin And Related Swelling    Swelling of joints   . Vistaril [Hydroxyzine Hcl] Other (See Comments)    "hyper"    Medications: Prior to Admission medications   Medication Sig Start Date End Date Taking? Authorizing Provider  albuterol (PROVENTIL HFA;VENTOLIN HFA) 108 (90 Base) MCG/ACT inhaler Inhale into the lungs. 03/26/15 03/25/16  [provider]  b complex vitamins capsule Take 1 capsule by mouth at bedtime.    [provider]  Calcium-Magnesium-Vitamin D (CALCIUM 500 PO) Take 1 tablet by mouth daily.    [provider]  calcium-vitamin D (OSCAL WITH D) 500-200 MG-UNIT TABS tablet Take 1 tablet by mouth daily.    [provider]  cetirizine (ZYRTEC) 10 MG tablet Take 10 mg by mouth at bedtime.    [provider]  citalopram (CELEXA) 40 MG tablet Take 40 mg by mouth  daily.    [provider]  cyclobenzaprine (FLEXERIL) 10 MG tablet Take 1 tablet by mouth as needed.    [provider]  ibuprofen (ADVIL,MOTRIN) 800 MG tablet Take by mouth.    [provider]  LORazepam (ATIVAN) 0.5 MG tablet Take 1 tablet by mouth as needed.    [provider]  losartan (COZAAR) 100 MG tablet Take 1 tablet by mouth daily.    [provider]  meloxicam (MOBIC) 15 MG tablet Take 1 tablet by mouth as needed.    [provider]  montelukast (SINGULAIR) 10 MG tablet Take 10 mg by mouth at bedtime.    [provider]  norethindrone (CAMILA) 0.35 MG tablet Take 1 tablet (0.35 mg total) by mouth daily. 07/12/19   Farrel Conners, CNM  phentermine 15 MG capsule Take by mouth. 05/21/18 06/20/18  [provider]    Physical Exam Vitals: Blood pressure 132/72, height 5' 10.8" (1.798 m), weight (!) 328 lb 6.4 oz (149 kg), last  menstrual period 06/30/2020.  Physical Exam Constitutional:      Appearance: She is well-developed.  Genitourinary:     Genitourinary Comments: External: Normal appearing vulva. No lesions noted.  Bimanual examination: Uterus midline, enlarged 10-12 cm . No CMT. No adnexal masses. No adnexal tenderness. Pelvis not fixed.  Breast Exam: breast equal without skin changes, nipple discharge, breast lump or enlarged lymph nodes   HENT:     Head: Normocephalic and atraumatic.  Neck:     Thyroid: No thyromegaly.  Cardiovascular:     Rate and Rhythm: Normal rate and regular rhythm.     Heart sounds: Normal heart sounds.  Pulmonary:     Effort: Pulmonary effort is normal.     Breath sounds: Normal breath sounds.  Abdominal:     General: Bowel sounds are normal. There is no distension.     Palpations: Abdomen is soft. There is no mass.  Musculoskeletal:     Cervical back: Neck supple.  Neurological:     Mental Status: She is alert and oriented to person, place, and time.  Skin:     General: Skin is warm and dry.  Psychiatric:        Behavior: Behavior normal.        Thought Content: Thought content normal.        Judgment: Judgment normal.  Vitals reviewed.      Female chaperone present for pelvic and breast  portions of the physical exam  Assessment: 50 y.o. G1P1001 routine annual exam  Plan: Problem List Items Addressed This Visit   None   Visit Diagnoses    Encounter for annual routine gynecological examination    -  Primary   Health maintenance examination       Breast cancer screening by mammogram       Relevant Orders   MM 3D SCREEN BREAST BILATERAL   Colon cancer screening       Cervical cancer screening       Encounter for gynecological examination       Encounter for gynecological examination without abnormal finding       Encounter for screening breast examination       Encounter for birth control pills maintenance       Relevant Medications   norethindrone (CAMILA) 0.35 MG tablet   Enlarged uterus       Relevant Orders   US PELVIS TRANSVAGINAL NON-OB (TV ONLY)      1) Mammogram - recommend yearly screening mammogram.  Mammogram Was ordered today  2) STI screening was offered and declined  3) ASCCP guidelines and rational discussed.  Patient opts for every 5 years screening interval  4) Contraception - continue Camila  5) Colonoscopy -- up to date, performed 2020, 10 year follow up recommended per patient.  6) Routine healthcare maintenance including cholesterol, diabetes screening discussed managed by PCP.  7) Bulky and enlarged uterus- wo;; follow up for pelvic US.   Adelene Idler MD, [facility], [facility] 07/14/2020 9:07 AM

## 2020-07-14 NOTE — Patient Instructions (Signed)
Institute of Medicine Recommended Dietary Allowances for Calcium and Vitamin D  Age (yr) Calcium Recommended Dietary Allowance (mg/day) Vitamin D Recommended Dietary Allowance (international units/day)  9-18 1,300 600  19-50 1,000 600  51-70 1,200 600  71 and older 1,200 800  Data from Institute of Medicine. Dietary reference intakes: calcium, vitamin D. [place], [place]: [employer]; 2011.    Exercising to Stay Healthy To become healthy and stay healthy, it is recommended that you do moderate-intensity and vigorous-intensity exercise. You can tell that you are exercising at a moderate intensity if your heart starts beating faster and you start breathing faster but can still hold a conversation. You can tell that you are exercising at a vigorous intensity if you are breathing much harder and faster and cannot hold a conversation while exercising. Exercising regularly is important. It has many health benefits, such as:  Improving overall fitness, flexibility, and endurance.  Increasing bone density.  Helping with weight control.  Decreasing body fat.  Increasing muscle strength.  Reducing stress and tension.  Improving overall health. How often should I exercise? Choose an activity that you enjoy, and set realistic goals. Your health care provider can help you make an activity plan that works for you. Exercise regularly as told by your health care provider. This may include:  Doing strength training two times a week, such as: ? Lifting weights. ? Using resistance bands. ? Push-ups. ? Sit-ups. ? Yoga.  Doing a certain intensity of exercise for a given amount of time. Choose from these options: ? A total of 150 minutes of moderate-intensity exercise every week. ? A total of 75 minutes of vigorous-intensity exercise every week. ? A mix of moderate-intensity and vigorous-intensity exercise every week. Children, pregnant women, people who have not exercised  regularly, people who are overweight, and older adults may need to talk with a health care provider about what activities are safe to do. If you have a medical condition, be sure to talk with your health care provider before you start a new exercise program. What are some exercise ideas? Moderate-intensity exercise ideas include:  Walking 1 mile (1.6 km) in about 15 minutes.  Biking.  Hiking.  Golfing.  Dancing.  Water aerobics. Vigorous-intensity exercise ideas include:  Walking 4.5 miles (7.2 km) or more in about 1 hour.  Jogging or running 5 miles (8 km) in about 1 hour.  Biking 10 miles (16.1 km) or more in about 1 hour.  Lap swimming.  Roller-skating or in-line skating.  Cross-country skiing.  Vigorous competitive sports, such as football, basketball, and soccer.  Jumping rope. Aero Budget-Friendly Healthy Eating There are many ways to save money at the grocery store and continue to eat healthy. You can be successful if you: Plan meals according to your budget. Make a grocery list and only purchase food according to your grocery list. Prepare food yourself at home. What are tips for following this plan? Reading food labels Compare food labels between brand name foods and the store brand. Often the nutritional value is the same, but the store brand is lower cost. Look for products that do not have added sugar, fat, or salt (sodium). These often cost the same but are healthier for you. Products may be labeled as: Sugar-free. Nonfat. Low-fat. Sodium-free. Low-sodium. Look for lean ground beef labeled as at least 92% lean and 8% fat. Shopping Buy only the items on your grocery list and go only to the areas of the store that have the items  on your list. Use coupons only for foods and brands you normally buy. Avoid buying items you wouldn't normally buy simply because they are on sale. Check online and in newspapers for weekly deals. Buy healthy items from the bulk  bins when available, such as herbs, spices, flour, pasta, nuts, and dried fruit. Buy fruits and vegetables that are in season. Prices are usually lower on in-season produce. Look at the unit price on the price tag. Use it to compare different brands and sizes to find out which item is the best deal. Choose healthy items that are often low-cost, such as carrots, potatoes, apples, bananas, and oranges. Dried or canned beans are a low-cost protein source. Buy in bulk and freeze extra food. Items you can buy in bulk include meats, fish, poultry, frozen fruits, and frozen vegetables. Avoid buying "ready-to-eat" foods, such as pre-cut fruits and vegetables and pre-made salads. If possible, shop around to discover where you can find the best prices. Consider other retailers such as dollar stores, larger [employer], local fruit and vegetable stands, and farmers markets. Do not shop when you are hungry. If you shop while hungry, it may be hard to stick to your list and budget. Resist impulse buying. Use your grocery list as your official plan for the week. Buy a variety of vegetables and fruits by purchasing fresh, frozen, and canned items. Look at the top and bottom shelves for deals. Foods at eye level (eye level of an adult or child) are usually more expensive. Be efficient with your time when shopping. The more time you spend at the store, the more money you are likely to spend. To save money when choosing more expensive foods like meats and dairy: Choose cheaper cuts of meat, such as bone-in chicken thighs and drumsticks instead of skinless and boneless chicken. When you are ready to prepare the chicken, you can remove the skin yourself to make it healthier. Choose lean meats like chicken or [place] instead of beef. Choose canned seafood, such as tuna, salmon, or sardines. Buy eggs as a low-cost source of protein. Buy dried beans and peas, such as lentils, split peas, or kidney beans instead of  meats. Dried beans and peas are a good alternative source of protein. Buy the larger tubs of yogurt instead of individual-sized containers. Choose water instead of sodas and other sweetened beverages. Avoid buying chips, cookies, and other "junk food." These items are usually expensive and not healthy.   Cooking Make extra food and freeze the extras in meal-sized containers or in individual portions for fast meals and snacks. Pre-cook on days when you have extra time to prepare meals in advance. You can keep these meals in the fridge or freezer and reheat for a quick meal. When you come home from the grocery store, wash, peel, and cut fruits and vegetables so they are ready to use and eat. This will help reduce food waste. Meal planning Do not eat out or get fast food. Prepare food at home. Make a grocery list and make sure to bring it with you to the store. If you have a smart phone, you could use your phone to create your shopping list. Plan meals and snacks according to a grocery list and budget you create. Use leftovers in your meal plan for the week. Look for recipes where you can cook once and make enough food for two meals. Prepare budget-friendly types of meals like stews, casseroles, and stir-fry dishes. Try some meatless meals or try "  no cook" meals like salads. Make sure that half your plate is filled with fruits or vegetables. Choose from fresh, frozen, or canned fruits and vegetables. If eating canned, remember to rinse them before eating. This will remove any excess salt added for packaging. Summary Eating healthy on a budget is possible if you plan your meals according to your budget, purchase according to your budget and grocery list, and prepare food yourself. Tips for buying more food on a limited budget include buying generic brands, using coupons only for foods you normally buy, and buying healthy items from the bulk bins when available. Tips for buying cheaper food to replace  expensive food include choosing cheaper, lean cuts of meat, and buying dried beans and peas. This information is not intended to replace advice given to you by your health care provider. Make sure you discuss any questions you have with your health care provider. Document Revised: 01/17/2020 Document Reviewed: 01/17/2020 Elsevier Patient Education  2021 Elsevier Inc.    Bone Health Bones protect organs, store calcium, anchor muscles, and support the whole body. Keeping your bones strong is important, especially as you get older. You can take actions to help keep your bones strong and healthy. Why is keeping my bones healthy important? Keeping your bones healthy is important because your body constantly replaces bone cells. Cells get old, and new cells take their place. As we age, we lose bone cells because the body may not be able to make enough new cells to replace the old cells. The amount of bone cells and bone tissue you have is referred to as bone mass. The higher your bone mass, the stronger your bones. The aging process leads to an overall loss of bone mass in the body, which can increase the likelihood of: Joint pain and stiffness. Broken bones. A condition in which the bones become weak and brittle (osteoporosis). A large decline in bone mass occurs in older adults. In women, it occurs about the time of menopause.   What actions can I take to keep my bones healthy? Good health habits are important for maintaining healthy bones. This includes eating nutritious foods and exercising regularly. To have healthy bones, you need to get enough of the right minerals and vitamins. Most nutrition experts recommend getting these nutrients from the foods that you eat. In some cases, taking supplements may also be recommended. Doing certain types of exercise is also important for bone health. What are the nutritional recommendations for healthy bones? Eating a well-balanced diet with plenty of calcium  and vitamin D will help to protect your bones. Nutritional recommendations vary from person to person. Ask your health care provider what is healthy for you. Here are some general guidelines. Get enough calcium Calcium is the most important (essential) mineral for bone health. Most people can get enough calcium from their diet, but supplements may be recommended for people who are at risk for osteoporosis. Good sources of calcium include: Dairy products, such as low-fat or nonfat milk, cheese, and yogurt. Dark green leafy vegetables, such as bok choy and broccoli. Calcium-fortified foods, such as orange juice, cereal, bread, soy beverages, and tofu products. Nuts, such as almonds. Follow these recommended amounts for daily calcium intake: Children, age 68-3: 700 mg. Children, age 48-8: 1,000 mg. Children, age 68089-13: 1,300 mg. Teens, age 48-18: 1,300 mg. Adults, age 519-50: 1,000 mg. Adults, age 48-70: Men: 1,000 mg. Women: 1,200 mg. Adults, age 48 or older: 1,200 mg. Pregnant and breastfeeding females: Teens:  1,300 mg. Adults: 1,000 mg. Get enough vitamin D Vitamin D is the most essential vitamin for bone health. It helps the body absorb calcium. Sunlight stimulates the skin to make vitamin D, so be sure to get enough sunlight. If you live in a cold climate or you do not get outside often, your health care provider may recommend that you take vitamin D supplements. Good sources of vitamin D in your diet include: Egg yolks. Saltwater fish. Milk and cereal fortified with vitamin D. Follow these recommended amounts for daily vitamin D intake: Children and teens, age 18-18: 600 international units. Adults, age 60 or younger: 400-800 international units. Adults, age 40 or older: 800-1,000 international units. Get other important nutrients Other nutrients that are important for bone health include: Phosphorus. This mineral is found in meat, poultry, dairy foods, nuts, and legumes. The recommended  daily intake for adult men and adult women is 700 mg. Magnesium. This mineral is found in seeds, nuts, dark green vegetables, and legumes. The recommended daily intake for adult men is 400-420 mg. For adult women, it is 310-320 mg. Vitamin K. This vitamin is found in green leafy vegetables. The recommended daily intake is 120 mg for adult men and 90 mg for adult women.   What type of physical activity is best for building and maintaining healthy bones? Weight-bearing and strength-building activities are important for building and maintaining healthy bones. Weight-bearing activities cause muscles and bones to work against gravity. Strength-building activities increase the strength of the muscles that support bones. Weight-bearing and muscle-building activities include: Walking and hiking. Jogging and running. Dancing. Gym exercises. Lifting weights. Tennis and racquetball. Climbing stairs. Aerobics. Adults should get at least 30 minutes of moderate physical activity on most days. Children should get at least 60 minutes of moderate physical activity on most days. Ask your health care provider what type of exercise is best for you.   How can I find out if my bone mass is low? Bone mass can be measured with an X-ray test called a bone mineral density (BMD) test. This test is recommended for all women who are age 47 or older. It may also be recommended for: Men who are age 33 or older. People who are at risk for osteoporosis because of: Having bones that break easily. Having a long-term disease that weakens bones, such as kidney disease or rheumatoid arthritis. Having menopause earlier than normal. Taking medicine that weakens bones, such as steroids, thyroid hormones, or hormone treatment for breast cancer or prostate cancer. Smoking. Drinking three or more alcoholic drinks a day. If you find that you have a low bone mass, you may be able to prevent osteoporosis or further bone loss by changing  your diet and lifestyle. Where can I find more information? For more information, check out the following websites: National Osteoporosis Foundation: https://carlson-fletcher.info/ [employer]: www.bones.http://www.myers.net/ International Osteoporosis Foundation: Investment banker, operational.iofbonehealth.org Summary The aging process leads to an overall loss of bone mass in the body, which can increase the likelihood of broken bones and osteoporosis. Eating a well-balanced diet with plenty of calcium and vitamin D will help to protect your bones. Weight-bearing and strength-building activities are also important for building and maintaining strong bones. Bone mass can be measured with an X-ray test called a bone mineral density (BMD) test. This information is not intended to replace advice given to you by your health care provider. Make sure you discuss any questions you have with your health care provider. Document Revised: 05/02/2017  Document Reviewed: 05/02/2017 Elsevier Patient Education  2021 [employer].    bic dancing.   What are some everyday activities that can help me to get exercise?  Yard work, such as: ? Pushing a Surveyor, mining. ? Raking and bagging leaves.  Washing your car.  Pushing a stroller.  Shoveling snow.  Gardening.  Washing windows or floors. How can I be more active in my day-to-day activities?  Use stairs instead of an elevator.  Take a walk during your lunch break.  If you drive, park your car farther away from your work or school.  If you take public transportation, get off one stop early and walk the rest of the way.  Stand up or walk around during all of your indoor phone calls.  Get up, stretch, and walk around every 30 minutes throughout the day.  Enjoy exercise with a friend. Support to continue exercising will help you keep a regular routine of activity. What guidelines can I follow while exercising?  Before you start a new exercise program, talk with your health  care provider.  Do not exercise so much that you hurt yourself, feel dizzy, or get very short of breath.  Wear comfortable clothes and wear shoes with good support.  Drink plenty of water while you exercise to prevent dehydration or heat stroke.  Work out until your breathing and your heartbeat get faster. Where to find more information  U.S. Department of Health and Human Services: ThisPath.fi  Centers for Disease Control and Prevention (CDC): FootballExhibition.com.br Summary  Exercising regularly is important. It will improve your overall fitness, flexibility, and endurance.  Regular exercise also will improve your overall health. It can help you control your weight, reduce stress, and improve your bone density.  Do not exercise so much that you hurt yourself, feel dizzy, or get very short of breath.  Before you start a new exercise program, talk with your health care provider. This information is not intended to replace advice given to you by your health care provider. Make sure you discuss any questions you have with your health care provider. Document Revised: 03/18/2017 Document Reviewed: 02/24/2017 Elsevier Patient Education  2021 [employer].

## 2020-07-15 DIAGNOSIS — Z3041 Encounter for surveillance of contraceptive pills: Secondary | ICD-10-CM

## 2020-07-16 MED ORDER — NORETHINDRONE 0.35 MG PO TABS: 1.0000 | ORAL_TABLET | Freq: Every day | ORAL | 4 refills | Status: DC

## 2020-08-14 ENCOUNTER — Ambulatory Visit
Admission: RE | Admit: 2020-08-14 | Discharge: 2020-08-14 | Disposition: A | Discharge status: Home / Self Care | Payer: No Typology Code available for payment source | Source: Ambulatory Visit | Attending: Obstetrics and Gynecology | Admitting: Obstetrics and Gynecology

## 2020-08-14 ENCOUNTER — Other Ambulatory Visit: Payer: Self-pay

## 2020-08-14 DIAGNOSIS — Z1231 Encounter for screening mammogram for malignant neoplasm of breast: Secondary | ICD-10-CM | POA: Diagnosis not present

## 2020-08-20 ENCOUNTER — Other Ambulatory Visit: Payer: Self-pay

## 2020-08-20 ENCOUNTER — Ambulatory Visit (INDEPENDENT_AMBULATORY_CARE_PROVIDER_SITE_OTHER): Payer: No Typology Code available for payment source | Admitting: Obstetrics and Gynecology

## 2020-08-20 ENCOUNTER — Encounter: Payer: Self-pay | Admitting: Obstetrics and Gynecology

## 2020-08-20 VITALS — BP 128/72 | Ht 69.0 in | Wt 325.0 lb

## 2020-08-20 DIAGNOSIS — N852 Hypertrophy of uterus: Secondary | ICD-10-CM | POA: Diagnosis not present

## 2020-08-20 NOTE — Progress Notes (Signed)
Reviewed normal pelvic US result    Patient ID: Sarah Lozano, female   DOB: [birth year], 50 y.o.   MRN: [id number]  Reason for Consult: Follow-up   Referred by Rolm Gala, MD  Subjective:     HPI:  Sarah Lozano is a 50 y.o. female. She is here for [place] follow up for a bulky uterus. She has no new complaints.    Past Medical History:  Diagnosis Date  . Anxiety   . Arthritis   . Depression   . Fracture of 4th metatarsal 6/20/209   Left  . Fracture of 5th metatarsal 10/06/2017   Left  . Gonorrhea 11/2013  . Hypertension   . Hypothyroidism   . Morbid obesity with BMI of 50.0-59.9, adult (HCC)   . Pelvic floor dysfunction    in complete emptying of bowel  . Polycystic ovarian syndrome    insulin resistent   Family History  Problem Relation Age of Onset  . Hypertension Father   . Prostate cancer Father 82  . Heart failure Maternal Grandmother   . Hypertension Maternal Grandmother   . Alzheimer's disease Maternal Grandmother   . Heart failure Maternal Grandfather   . Hypertension Maternal Grandfather   . Diabetes Paternal Grandfather   . Heart failure Paternal Grandfather   . Hypertension Paternal Grandfather   . Prostate cancer Paternal Grandfather   . Hypertension Paternal Grandmother   . Breast cancer Neg Hx    Past Surgical History:  Procedure Laterality Date  . BUNIONECTOMY Right 01/2009  . COLONOSCOPY  12/12/2015   rectal bleeding, one polyp and internal hemorrhoid  . COLONOSCOPY W/ BIOPSIES  01/25/2019   hyperplastic and adenomatous polyps removed-next colonoscopy due in 5 years  . COSMETIC SURGERY  1975   lip laceration was repaired  . DILATION AND CURETTAGE OF UTERUS  2000  . HYSTEROSCOPY WITH D & C N/A 08/29/2015   Procedure: DILATATION AND CURETTAGE /HYSTEROSCOPY;  Surgeon: Nadara Mustard, MD;  Location: [facility] ORS;  Service: Gynecology;  Laterality: N/A;  . WISDOM TOOTH EXTRACTION      Short Social History:  Social History   Tobacco Use  .  Smoking status: Former Smoker    Types: Cigarettes    Quit date: 12/22/2008    Years since quitting: 11.6  . Smokeless tobacco: Never Used  Substance Use Topics  . Alcohol use: Yes    Comment: rare    Allergies  Allergen Reactions  . Lisinopril Cough  . Metformin And Related Swelling    Swelling of joints   . Vistaril [Hydroxyzine Hcl] Other (See Comments)    "hyper"    Current Outpatient Medications  Medication Sig Dispense Refill  . b complex vitamins capsule Take 1 capsule by mouth at bedtime.    . Calcium-Magnesium-Vitamin D (CALCIUM 500 PO) Take 1 tablet by mouth daily.    . calcium-vitamin D (OSCAL WITH D) 500-200 MG-UNIT TABS tablet Take 1 tablet by mouth daily.    . cetirizine (ZYRTEC) 10 MG tablet Take 10 mg by mouth at bedtime.    . citalopram (CELEXA) 40 MG tablet Take 40 mg by mouth daily.    . cyclobenzaprine (FLEXERIL) 10 MG tablet Take 1 tablet by mouth as needed.    Marland Kitchen ibuprofen (ADVIL,MOTRIN) 800 MG tablet Take by mouth.    Marland Kitchen LORazepam (ATIVAN) 0.5 MG tablet Take 1 tablet by mouth as needed.    Marland Kitchen losartan (COZAAR) 100 MG tablet Take 1 tablet by mouth daily.    Marland Kitchen  meloxicam (MOBIC) 15 MG tablet Take 1 tablet by mouth as needed.    . montelukast (SINGULAIR) 10 MG tablet Take 10 mg by mouth at bedtime.    . norethindrone (CAMILA) 0.35 MG tablet Take 1 tablet (0.35 mg total) by mouth daily. 90 tablet 4  . albuterol (PROVENTIL HFA;VENTOLIN HFA) 108 (90 Base) MCG/ACT inhaler Inhale into the lungs.    . phentermine 15 MG capsule Take by mouth.     No current facility-administered medications for this visit.    Review of Systems  Constitutional: Negative for chills, fatigue, fever and unexpected weight change.  HENT: Negative for trouble swallowing.  Eyes: Negative for loss of vision.  Respiratory: Negative for cough, shortness of breath and wheezing.  Cardiovascular: Negative for chest pain, leg swelling, palpitations and syncope.  GI: Negative for abdominal pain,  blood in stool, diarrhea, nausea and vomiting.  GU: Negative for difficulty urinating, dysuria, frequency and hematuria.  Musculoskeletal: Negative for back pain, leg pain and joint pain.  Skin: Negative for rash.  Neurological: Negative for dizziness, headaches, light-headedness, numbness and seizures.  Psychiatric: Negative for behavioral problem, confusion, depressed mood and sleep disturbance.        Objective:  Objective   Vitals:   08/20/20 0847  BP: 128/72  Weight: (!) 325 lb (147.4 kg)  Height: 5\' 9"  (1.753 m)   Body mass index is 47.99 kg/m.  Physical Exam Vitals and nursing note reviewed. Exam conducted with a chaperone present.  Constitutional:      Appearance: Normal appearance.  HENT:     Head: Normocephalic and atraumatic.  Eyes:     Extraocular Movements: Extraocular movements intact.     Pupils: Pupils are equal, round, and reactive to light.  Cardiovascular:     Rate and Rhythm: Normal rate and regular rhythm.  Pulmonary:     Effort: Pulmonary effort is normal.     Breath sounds: Normal breath sounds.  Abdominal:     General: Abdomen is flat.     Palpations: Abdomen is soft.  Musculoskeletal:     Cervical back: Normal range of motion.  Skin:    General: Skin is warm and dry.  Neurological:     General: No focal deficit present.     Mental Status: She is alert and oriented to person, place, and time.  Psychiatric:        Behavior: Behavior normal.        Thought Content: Thought content normal.        Judgment: Judgment normal.     Assessment/Plan:    50 yo here for pelvic 52 follow up Reviewed her normal [place] report with her in detail.  No new concerns.   More than 5 minutes were spent face to face with the patient in the room, reviewing the medical record, labs and images, and coordinating care for the patient. The plan of management was discussed in detail and counseling was provided.    [place] MD [facility], [facility] 08/20/2020 9:49 AM

## 2021-07-29 ENCOUNTER — Ambulatory Visit (INDEPENDENT_AMBULATORY_CARE_PROVIDER_SITE_OTHER): Payer: No Typology Code available for payment source | Admitting: Obstetrics and Gynecology

## 2021-07-29 ENCOUNTER — Encounter: Payer: Self-pay | Admitting: Obstetrics and Gynecology

## 2021-07-29 ENCOUNTER — Other Ambulatory Visit (HOSPITAL_COMMUNITY)
Admission: RE | Admit: 2021-07-29 | Discharge: 2021-07-29 | Disposition: A | Discharge status: Home / Self Care | Payer: No Typology Code available for payment source | Source: Ambulatory Visit | Attending: Obstetrics and Gynecology | Admitting: Obstetrics and Gynecology

## 2021-07-29 VITALS — BP 120/80 | Ht 69.0 in | Wt 341.0 lb

## 2021-07-29 DIAGNOSIS — Z3041 Encounter for surveillance of contraceptive pills: Secondary | ICD-10-CM | POA: Diagnosis not present

## 2021-07-29 DIAGNOSIS — Z124 Encounter for screening for malignant neoplasm of cervix: Secondary | ICD-10-CM

## 2021-07-29 DIAGNOSIS — Z1231 Encounter for screening mammogram for malignant neoplasm of breast: Secondary | ICD-10-CM

## 2021-07-29 DIAGNOSIS — Z01419 Encounter for gynecological examination (general) (routine) without abnormal findings: Secondary | ICD-10-CM

## 2021-07-29 MED ORDER — NORETHINDRONE 0.35 MG PO TABS: 1.0000 | ORAL_TABLET | Freq: Every day | ORAL | 4 refills | Status: DC

## 2021-07-29 NOTE — Progress Notes (Signed)
? ? ?Gynecology Annual Exam  ?PCP: Rolm GalaGrandis, Heidi, MD ? ?Chief Complaint:  ?Chief Complaint  ?Patient presents with  ? Annual Exam  ? ? ?History of Present Illness: Patient is a 51 y.o. G1P1001 presents for annual exam. The patient has no complaints today.  ? ?LMP: Patient's last menstrual period was 07/04/2021. ?Average Interval: monthly ?Duration of flow: 2 days ? ?Heavy Menses: no ?Dysmenorrhea: no ? ?She denies passage of large clots ?She denies sensations of gushing or flooding of blood. ?She denies accidents where she bleeds through her clothing. ?She denies that she changes a saturated pad or tampon more frequently than every hour.  ?She denies that pain from her periods limits her activities. ? ?The patient does perform self breast exams.  There is no notable family history of breast or ovarian cancer in her family. ? ?The patient has regular exercise: walking on a treadmill and workign with a personal trainer ? ?Review of Systems: Review of Systems  ?Constitutional:  Negative for chills, fever, malaise/fatigue and weight loss.  ?HENT:  Negative for congestion, hearing loss and sinus pain.   ?Eyes:  Negative for blurred vision and double vision.  ?Respiratory:  Negative for cough, sputum production, shortness of breath and wheezing.   ?Cardiovascular:  Negative for chest pain, palpitations, orthopnea and leg swelling.  ?Gastrointestinal:  Negative for abdominal pain, constipation, diarrhea, nausea and vomiting.  ?Genitourinary:  Negative for dysuria, flank pain, frequency, hematuria and urgency.  ?Musculoskeletal:  Positive for joint pain. Negative for back pain and falls.  ?Skin:  Negative for itching and rash.  ?Neurological:  Negative for dizziness and headaches.  ?Endo/Heme/Allergies:  Positive for environmental allergies.  ?Psychiatric/Behavioral:  Negative for depression, substance abuse and suicidal ideas. The patient is not nervous/anxious.   ? ?Past Medical History:  ?Past Medical History:   ?Diagnosis Date  ? Anxiety   ? Arthritis   ? Depression   ? Fracture of 4th metatarsal 6/20/209  ? Left  ? Fracture of 5th metatarsal 10/06/2017  ? Left  ? Gonorrhea 11/2013  ? Hypertension   ? Hypothyroidism   ? Morbid obesity with BMI of 50.0-59.9, adult ([facility])   ? Pelvic floor dysfunction   ? in complete emptying of bowel  ? Polycystic ovarian syndrome   ? insulin resistent  ? ? ?Past Surgical History:  ?Past Surgical History:  ?Procedure Laterality Date  ? BUNIONECTOMY Right 01/2009  ? COLONOSCOPY  12/12/2015  ? rectal bleeding, one polyp and internal hemorrhoid  ? COLONOSCOPY W/ BIOPSIES  01/25/2019  ? hyperplastic and adenomatous polyps removed-next colonoscopy due in 5 years  ? COSMETIC SURGERY  1975  ? lip laceration was repaired  ? DILATION AND CURETTAGE OF UTERUS  2000  ? HYSTEROSCOPY WITH D & C N/A 08/29/2015  ? Procedure: DILATATION AND CURETTAGE /HYSTEROSCOPY;  Surgeon: Nadara Mustardobert P Harris, MD;  Location: [facility] ORS;  Service: Gynecology;  Laterality: N/A;  ? WISDOM TOOTH EXTRACTION    ? ? ?Gynecologic History:  ?Patient's last menstrual period was 07/04/2021. ?Menarche: 14 ? ?History of fibroids, polyps, or ovarian cysts? : yes  ?History of PCOS? yes ?Hstory of Endometriosis? no ?History of abnormal pap smears? no ?Have you had any sexually transmitted infections in the past?  yes ? ?Last Pap: Results were: 2020  NIL   ? ?She identifies as a female. She is not sexually active.  ? She denies dyspareunia. She denies postcoital bleeding.  ? ? ?Obstetric History: G1P1001 ? ?Family History:  ?Family History  ?  Problem Relation Age of Onset  ? Hypertension Father   ? Prostate cancer Father 26  ? Heart failure Maternal Grandmother   ? Hypertension Maternal Grandmother   ? Alzheimer's disease Maternal Grandmother   ? Heart failure Maternal Grandfather   ? Hypertension Maternal Grandfather   ? Diabetes Paternal Grandfather   ? Heart failure Paternal Grandfather   ? Hypertension Paternal Grandfather   ? Prostate cancer  Paternal Grandfather   ? Hypertension Paternal Grandmother   ? Breast cancer Neg Hx   ? ? ?Social History:  ?Social History  ? ?Socioeconomic History  ? Marital status: Married  ?  Spouse name: Not on file  ? Number of children: 1  ? Years of education: Not on file  ? Highest education level: Master's degree (e.g., MA, MS, MEng, MEd, MSW, MBA)  ?Occupational History  ? Occupation: Therapist, sports  ?Tobacco Use  ? Smoking status: Former  ?  Types: Cigarettes  ?  Quit date: 12/22/2008  ?  Years since quitting: 12.6  ? Smokeless tobacco: Never  ?Vaping Use  ? Vaping Use: Never used  ?Substance and Sexual Activity  ? Alcohol use: Yes  ?  Comment: rare  ? Drug use: No  ? Sexual activity: Yes  ?  Birth control/protection: Pill  ?Other Topics Concern  ? Not on file  ?Social History Narrative  ? Not on file  ? ?Social Determinants of Health  ? ?Financial Resource Strain: Not on file  ?Food Insecurity: Not on file  ?Transportation Needs: Not on file  ?Physical Activity: Not on file  ?Stress: Not on file  ?Social Connections: Not on file  ?Intimate Partner Violence: Not on file  ? ? ?Allergies:  ?Allergies  ?Allergen Reactions  ? Lisinopril Cough  ? Metformin And Related Swelling  ?  Swelling of joints ?  ? Vistaril [Hydroxyzine Hcl] Other (See Comments)  ?  "hyper"  ? ? ?Medications: ?Prior to Admission medications   ?Medication Sig Start Date End Date Taking? Authorizing Provider  ?b complex vitamins capsule Take 1 capsule by mouth at bedtime.   Yes [provider]  ?Calcium-Magnesium-Vitamin D (CALCIUM 500 PO) Take 1 tablet by mouth daily.   Yes [provider]  ?calcium-vitamin D (OSCAL WITH D) 500-200 MG-UNIT TABS tablet Take 1 tablet by mouth daily.   Yes [provider]  ?cetirizine (ZYRTEC) 10 MG tablet Take 10 mg by mouth at bedtime.   Yes [provider]  ?citalopram (CELEXA) 40 MG tablet Take 40 mg by mouth daily.   Yes [provider]  ?cyclobenzaprine (FLEXERIL)  10 MG tablet Take 1 tablet by mouth as needed.   Yes [provider]  ?ibuprofen (ADVIL,MOTRIN) 800 MG tablet Take by mouth.   Yes [provider]  ?LORazepam (ATIVAN) 0.5 MG tablet Take 1 tablet by mouth as needed.   Yes [provider]  ?losartan (COZAAR) 100 MG tablet Take 1 tablet by mouth daily.   Yes [provider]  ?meloxicam (MOBIC) 15 MG tablet Take 1 tablet by mouth as needed.   Yes [provider]  ?montelukast (SINGULAIR) 10 MG tablet Take 10 mg by mouth at bedtime.   Yes [provider]  ?norethindrone (CAMILA) 0.35 MG tablet Take 1 tablet (0.35 mg total) by mouth daily. 07/16/20  Yes Ian Castagna R, MD  ?albuterol (PROVENTIL HFA;VENTOLIN HFA) 108 (90 Base) MCG/ACT inhaler Inhale into the lungs. 03/26/15 03/25/16  [provider]  ?phentermine 15 MG capsule Take  by mouth. 05/21/18 06/20/18  [provider]  ? ? ?Physical Exam ?Vitals: Blood pressure 120/80, height 5\' 9"  (1.753 m), weight (!) 341 lb (154.7 kg), last menstrual period 07/04/2021. ? ?Physical Exam ?Constitutional:   ?   Appearance: She is well-developed.  ?Genitourinary:  ?   Genitourinary Comments: External: Normal appearing vulva. No lesions noted.  ?Speculum examination: Normal appearing cervix. No blood in the vaginal vault. No discharge.  Bimanual examination: Uterus midline, non-tender, normal in size, shape and contour.  No CMT. No adnexal masses. No adnexal tenderness. Pelvis not fixed. ? ?Breast Exam: breast equal without skin changes, nipple discharge, breast lump or enlarged lymph nodes ?  ?HENT:  ?   Head: Normocephalic and atraumatic.  ?Neck:  ?   Thyroid: No thyromegaly.  ?Cardiovascular:  ?   Rate and Rhythm: Normal rate and regular rhythm.  ?   Heart sounds: Normal heart sounds.  ?Pulmonary:  ?   Effort: Pulmonary effort is normal.  ?   Breath sounds: Normal breath sounds.  ?Abdominal:  ?   General: Bowel sounds are normal. There is no distension.  ?    Palpations: Abdomen is soft. There is no mass.  ?Musculoskeletal:  ?   Cervical back: Neck supple.  ?Neurological:  ?   Mental Status: She is alert and oriented to person, place, and time.  ?Skin: ?   General:

## 2021-07-29 NOTE — Patient Instructions (Signed)
Institute of Medicine Recommended Dietary Allowances for Calcium and Vitamin D  ?Age ?(yr) Calcium ?Recommended ?Dietary Allowance ?(mg/day) Vitamin D ?Recommended ?Dietary Allowance ?(international units/day)  ?9-18 1,300 600  ?19-50 1,000 600  ?51-70 1,200 600  ?71 and older 1,200 800  ?Data from Institute of Medicine. Dietary reference intakes: calcium, vitamin D. [place], [place]: National Academies Press; 2011.  ? Exercising to Stay Healthy ?To become healthy and stay healthy, it is recommended that you do moderate-intensity and vigorous-intensity exercise. You can tell that you are exercising at a moderate intensity if your heart starts beating faster and you start breathing faster but can still hold a conversation. You can tell that you are exercising at a vigorous intensity if you are breathing much harder and faster and cannot hold a conversation while exercising. ?How can exercise benefit me? ?Exercising regularly is important. It has many health benefits, such as: ?Improving overall fitness, flexibility, and endurance. ?Increasing bone density. ?Helping with weight control. ?Decreasing body fat. ?Increasing muscle strength and endurance. ?Reducing stress and tension, anxiety, depression, or anger. ?Improving overall health. ?What guidelines should I follow while exercising? ?Before you start a new exercise program, talk with your health care provider. ?Do not exercise so much that you hurt yourself, feel dizzy, or get very short of breath. ?Wear comfortable clothes and wear shoes with good support. ?Drink plenty of water while you exercise to prevent dehydration or heat stroke. ?Work out until your breathing and your heartbeat get faster (moderate intensity). ?How often should I exercise? ?Choose an activity that you enjoy, and set realistic goals. Your health care provider can help you make an activity plan that is individually designed and works best for you. ?Exercise regularly as told by your health  care provider. This may include: ?Doing strength training two times a week, such as: ?Lifting weights. ?Using resistance bands. ?Push-ups. ?Sit-ups. ?Yoga. ?Doing a certain intensity of exercise for a given amount of time. Choose from these options: ?A total of 150 minutes of moderate-intensity exercise every week. ?A total of 75 minutes of vigorous-intensity exercise every week. ?A mix of moderate-intensity and vigorous-intensity exercise every week. ?Children, pregnant women, people who have not exercised regularly, people who are overweight, and older adults may need to talk with a health care provider about what activities are safe to perform. If you have a medical condition, be sure to talk with your health care provider before you start a new exercise program. ?What are some exercise ideas? ?Moderate-intensity exercise ideas include: ?Walking 1 mile (1.6 km) in about 15 minutes. ?Biking. ?Hiking. ?Golfing. ?Dancing. ?Water aerobics. ?Vigorous-intensity exercise ideas include: ?Walking 4.5 miles (7.2 km) or more in about 1 hour. ?Jogging or running 5 miles (8 km) in about 1 hour. ?Biking 10 miles (16.1 km) or more in about 1 hour. ?Lap swimming. ?Roller-skating or in-line skating. ?Cross-country skiing. ?Vigorous competitive sports, such as football, basketball, and soccer. ?Jumping rope. ?Aerobic dancing. ?What are some everyday activities that can help me get exercise? ?Yard work, such as: ?Pushing a lawn mower. ?Raking and bagging leaves. ?Washing your car. ?Pushing a stroller. ?Shoveling snow. ?Gardening. ?Washing windows or floors. ?How can I be more active in my day-to-day activities? ?Use stairs instead of an elevator. ?Take a walk during your lunch break. ?If you drive, park your car farther away from your work or school. ?If you take public transportation, get off one stop early and walk the rest of the way. ?Stand up or walk around during all of   your indoor phone calls. ?Get up, stretch, and walk  around every 30 minutes throughout the day. ?Enjoy exercise with a friend. Support to continue exercising will help you keep a regular routine of activity. ?Where to find more information ?You can find more information about exercising to stay healthy from: ?U.S. Department of Health and Human Services: www.hhs.gov ?Centers for Disease Control and Prevention (CDC): www.cdc.gov ?Summary ?Exercising regularly is important. It will improve your overall fitness, flexibility, and endurance. ?Regular exercise will also improve your overall health. It can help you control your weight, reduce stress, and improve your bone density. ?Do not exercise so much that you hurt yourself, feel dizzy, or get very short of breath. ?Before you start a new exercise program, talk with your health care provider. ?This information is not intended to replace advice given to you by your health care provider. Make sure you discuss any questions you have with your health care provider. ?Document Revised: 08/01/2020 Document Reviewed: 08/01/2020 ?Elsevier Patient Education ? 2022 Elsevier Inc. ?Budget-Friendly Healthy Eating ?There are many ways to save money at the grocery store and continue to eat healthy. You can be successful if you: ?Plan meals according to your budget. ?Make a grocery list and only purchase food according to your grocery list. ?Prepare food yourself at home. ?What are tips for following this plan? ?Reading food labels ?Compare food labels between brand name foods and the store brand. Often the nutritional value is the same, but the store brand is lower cost. ?Look for products that do not have added sugar, fat, or salt (sodium). These often cost the same but are healthier for you. Products may be labeled as: ?Sugar-free. ?Nonfat. ?Low-fat. ?Sodium-free. ?Low-sodium. ?Look for lean ground beef labeled as at least 92% lean and 8% fat. ?Shopping ? ?Buy only the items on your grocery list and go only to the areas of the store  that have the items on your list. ?Use coupons only for foods and brands you normally buy. Avoid buying items you wouldn't normally buy simply because they are on sale. ?Check online and in newspapers for weekly deals. ?Buy healthy items from the bulk bins when available, such as herbs, spices, flour, pasta, nuts, and dried fruit. ?Buy fruits and vegetables that are in season. Prices are usually lower on in-season produce. ?Look at the unit price on the price tag. Use it to compare different brands and sizes to find out which item is the best deal. ?Choose healthy items that are often low-cost, such as carrots, potatoes, apples, bananas, and oranges. Dried or canned beans are a low-cost protein source. ?Buy in bulk and freeze extra food. Items you can buy in bulk include meats, fish, poultry, frozen fruits, and frozen vegetables. ?Avoid buying "ready-to-eat" foods, such as pre-cut fruits and vegetables and pre-made salads. ?If possible, shop around to discover where you can find the best prices. Consider other retailers such as dollar stores, larger wholesale stores, local fruit and vegetable stands, and farmers markets. ?Do not shop when you are hungry. If you shop while hungry, it may be hard to stick to your list and budget. ?Resist impulse buying. Use your grocery list as your official plan for the week. ?Buy a variety of vegetables and fruits by purchasing fresh, frozen, and canned items. ?Look at the top and bottom shelves for deals. Foods at eye level (eye level of an adult or child) are usually more expensive. ?Be efficient with your time when shopping. The more time you   spend at the store, the more money you are likely to spend. ?To save money when choosing more expensive foods like meats and dairy: ?Choose cheaper cuts of meat, such as bone-in chicken thighs and drumsticks instead of skinless and boneless chicken. When you are ready to prepare the chicken, you can remove the skin yourself to make it  healthier. ?Choose lean meats like chicken or turkey instead of beef. ?Choose canned seafood, such as tuna, salmon, or sardines. ?Buy eggs as a low-cost source of protein. ?Buy dried beans and peas, such as le

## 2021-07-30 LAB — CYTOLOGY - PAP
Adequacy: ABSENT
Comment: NEGATIVE
Diagnosis: NEGATIVE
High risk HPV: NEGATIVE

## 2021-08-27 ENCOUNTER — Other Ambulatory Visit: Payer: Self-pay | Admitting: Family Medicine

## 2021-08-27 ENCOUNTER — Ambulatory Visit
Admission: RE | Admit: 2021-08-27 | Discharge: 2021-08-27 | Disposition: A | Discharge status: Home / Self Care | Payer: No Typology Code available for payment source | Source: Ambulatory Visit | Attending: Obstetrics and Gynecology | Admitting: Obstetrics and Gynecology

## 2021-08-27 DIAGNOSIS — Z1231 Encounter for screening mammogram for malignant neoplasm of breast: Secondary | ICD-10-CM

## 2021-08-28 ENCOUNTER — Other Ambulatory Visit: Payer: Self-pay | Admitting: Family Medicine

## 2021-09-01 ENCOUNTER — Other Ambulatory Visit: Payer: Self-pay | Admitting: Family Medicine

## 2021-09-01 DIAGNOSIS — R928 Other abnormal and inconclusive findings on diagnostic imaging of breast: Secondary | ICD-10-CM

## 2021-09-01 DIAGNOSIS — N6489 Other specified disorders of breast: Secondary | ICD-10-CM

## 2021-09-21 ENCOUNTER — Ambulatory Visit
Admission: RE | Admit: 2021-09-21 | Discharge: 2021-09-21 | Disposition: A | Discharge status: Home / Self Care | Payer: No Typology Code available for payment source | Source: Ambulatory Visit | Attending: Family Medicine | Admitting: Family Medicine

## 2021-09-21 DIAGNOSIS — N6489 Other specified disorders of breast: Secondary | ICD-10-CM

## 2021-09-21 DIAGNOSIS — R928 Other abnormal and inconclusive findings on diagnostic imaging of breast: Secondary | ICD-10-CM | POA: Insufficient documentation

## 2021-10-15 ENCOUNTER — Other Ambulatory Visit: Payer: Self-pay | Admitting: Physician Assistant

## 2021-10-15 DIAGNOSIS — R2240 Localized swelling, mass and lump, unspecified lower limb: Secondary | ICD-10-CM

## 2021-10-16 ENCOUNTER — Ambulatory Visit
Admission: RE | Admit: 2021-10-16 | Discharge: 2021-10-16 | Disposition: A | Discharge status: Home / Self Care | Payer: No Typology Code available for payment source | Source: Ambulatory Visit | Attending: Physician Assistant | Admitting: Physician Assistant

## 2021-10-16 DIAGNOSIS — R2240 Localized swelling, mass and lump, unspecified lower limb: Secondary | ICD-10-CM | POA: Insufficient documentation

## 2022-01-20 ENCOUNTER — Telehealth: Payer: Self-pay

## 2022-01-20 NOTE — Telephone Encounter (Signed)
Pt called triage left message stating she has been bleeding since 9/15. Returned call no answer left voice message to advise her if she is bleeding heavy filling up a pad every hour. Go to er. If its just spotty then she can continue to monitor or schedule an appointment for AUB

## 2022-02-05 ENCOUNTER — Other Ambulatory Visit: Payer: Self-pay | Admitting: Family Medicine

## 2022-02-05 ENCOUNTER — Other Ambulatory Visit: Payer: No Typology Code available for payment source

## 2022-02-05 ENCOUNTER — Other Ambulatory Visit (HOSPITAL_COMMUNITY): Payer: Self-pay | Admitting: Family Medicine

## 2022-02-05 DIAGNOSIS — N939 Abnormal uterine and vaginal bleeding, unspecified: Secondary | ICD-10-CM

## 2022-02-05 DIAGNOSIS — N938 Other specified abnormal uterine and vaginal bleeding: Secondary | ICD-10-CM

## 2022-02-08 ENCOUNTER — Ambulatory Visit
Admission: RE | Admit: 2022-02-08 | Discharge: 2022-02-08 | Disposition: A | Discharge status: Home / Self Care | Payer: No Typology Code available for payment source | Source: Ambulatory Visit | Attending: Family Medicine | Admitting: Family Medicine

## 2022-02-08 DIAGNOSIS — N939 Abnormal uterine and vaginal bleeding, unspecified: Secondary | ICD-10-CM | POA: Diagnosis present

## 2022-02-08 DIAGNOSIS — N938 Other specified abnormal uterine and vaginal bleeding: Secondary | ICD-10-CM | POA: Insufficient documentation

## 2022-02-10 ENCOUNTER — Ambulatory Visit (INDEPENDENT_AMBULATORY_CARE_PROVIDER_SITE_OTHER): Payer: No Typology Code available for payment source | Admitting: Obstetrics and Gynecology

## 2022-02-10 ENCOUNTER — Encounter: Payer: Self-pay | Admitting: Obstetrics and Gynecology

## 2022-02-10 VITALS — BP 116/70 | HR 62 | Ht 69.0 in | Wt 339.4 lb

## 2022-02-10 DIAGNOSIS — N926 Irregular menstruation, unspecified: Secondary | ICD-10-CM | POA: Diagnosis not present

## 2022-02-10 DIAGNOSIS — E282 Polycystic ovarian syndrome: Secondary | ICD-10-CM

## 2022-02-10 DIAGNOSIS — E039 Hypothyroidism, unspecified: Secondary | ICD-10-CM

## 2022-02-10 DIAGNOSIS — N951 Menopausal and female climacteric states: Secondary | ICD-10-CM

## 2022-02-10 NOTE — Progress Notes (Signed)
GYNECOLOGY PROGRESS NOTE  Subjective:    Patient ID: Sarah Lozano, female    DOB: [birth year], 51 y.o.   MRN: [id number]  HPI  Patient is a 51 y.o. G31P1001 female who presents for evaluation of abnormal uterine bleeding. She had a cycle that started on 01/01/2022 til 02/05/2022. She was seen by her PCP last week for weight management, and mention her symptoms at that appointment.  Notes that she was advised to stop taking her OCP and follow-up with GYN.  She reports that her bleeding went from heavier bleeding with passage of clots to lighter bleeding and now has completely stopped.  She did have a pelvic ultrasound that was performed 2 days ago.  Of note, patient mentions that she has had a history of irregular cycles since onset of menarche.  Also has a history of PCOS.  Has been using progesterone-only OCPs for some time to manage PCOS symptoms.  The following portions of the patient's history were reviewed and updated as appropriate:  She  has a past medical history of Anxiety, Arthritis, Depression, Fracture of 4th metatarsal (6/20/209), Fracture of 5th metatarsal (10/06/2017), Gonorrhea (11/2013), Hypertension, Hypothyroidism, Morbid obesity with BMI of 50.0-59.9, adult ([place]), Pelvic floor dysfunction, and Polycystic ovarian syndrome.  She  has a past surgical history that includes Dilation and curettage of uterus (2000); Bunionectomy (Right, 01/2009); Cosmetic surgery (1975); Wisdom tooth extraction; Hysteroscopy with D & C (N/A, 08/29/2015); Colonoscopy (12/12/2015); and Colonoscopy w/ biopsies (01/25/2019).  Her family history includes Alzheimer's disease in her maternal grandmother; Diabetes in her paternal grandfather; Heart failure in her maternal grandfather, maternal grandmother, and paternal grandfather; Hypertension in her father, maternal grandfather, maternal grandmother, paternal grandfather, and paternal grandmother; Prostate cancer in her paternal grandfather; Prostate cancer  (age of onset: 41) in her father.  She  reports that she quit smoking about 13 years ago. Her smoking use included cigarettes. She has never used smokeless tobacco. She reports current alcohol use. She reports that she does not use drugs.  Current Outpatient Medications on File Prior to Visit  Medication Sig Dispense Refill   b complex vitamins capsule Take 1 capsule by mouth at bedtime.     Calcium-Magnesium-Vitamin D (CALCIUM 500 PO) Take 1 tablet by mouth daily.     calcium-vitamin D (OSCAL WITH D) 500-200 MG-UNIT TABS tablet Take 1 tablet by mouth daily.     cetirizine (ZYRTEC) 10 MG tablet Take 10 mg by mouth at bedtime.     citalopram (CELEXA) 40 MG tablet Take 40 mg by mouth daily.     cyclobenzaprine (FLEXERIL) 10 MG tablet Take 1 tablet by mouth as needed.     ibuprofen (ADVIL,MOTRIN) 800 MG tablet Take by mouth.     LORazepam (ATIVAN) 0.5 MG tablet Take 1 tablet by mouth as needed.     losartan (COZAAR) 100 MG tablet Take 1 tablet by mouth daily.     meloxicam (MOBIC) 15 MG tablet Take 1 tablet by mouth as needed.     montelukast (SINGULAIR) 10 MG tablet Take 10 mg by mouth at bedtime.     norethindrone (CAMILA) 0.35 MG tablet Take 1 tablet (0.35 mg total) by mouth daily. 90 tablet 4   Semaglutide, 1 MG/DOSE, (OZEMPIC, 1 MG/DOSE,) 4 MG/3ML SOPN Ozempic 1 mg/dose (4 mg/3 mL) subcutaneous pen injector     Semaglutide,0.25 or 0.5MG /DOS, 2 MG/3ML SOPN Inject into the skin.     No current facility-administered medications on file prior to visit.  She is allergic to lisinopril, metformin and related, and vistaril [hydroxyzine hcl]..  Review of Systems Pertinent items noted in HPI and remainder of comprehensive ROS otherwise negative.   Objective:   Blood pressure 116/70, pulse 62, height 5\' 9"  (1.753 m), weight (!) 339 lb 6.4 oz (154 kg), last menstrual period 01/01/2022, SpO2 (!) 16 %. Body mass index is 50.12 kg/m. General appearance: alert, cooperative, and no distress,  morbidly obese Remainder of exam deferred today.   Imaging: CLINICAL DATA:  Abnormal vaginal bleeding   EXAM: TRANSABDOMINAL AND TRANSVAGINAL ULTRASOUND OF PELVIS   TECHNIQUE: Both transabdominal and transvaginal ultrasound examinations of the pelvis were performed. Transabdominal technique was performed for global imaging of the pelvis including uterus, ovaries, adnexal regions, and pelvic cul-de-sac. It was necessary to proceed with endovaginal exam following the transabdominal exam to visualize the endometrium.   COMPARISON:  None Available.   FINDINGS: Uterus   Measurements: 11.8 x 5.2 x 6.7 cm = volume: 215.1 mL. No fibroids or other mass visualized.   Endometrium   Thickness: 13.2.  No focal abnormality visualized.   Right ovary   Measurements: 2.7 x 1.5 x 2.7 cm = volume: 5.7 mL. There is a small simple cyst or follicle measuring 2.0 x 1.4 x 2.4 cm adjacent to the right ovary. No other adnexal masses.   Left ovary   Measurements: 3.6 x 2.8 by 3.1 cm = volume: 16.3 mL. There is a 2.2 x 2.2 x 2.5 cm simple cyst or follicle adjacent to the left ovary. No other adnexal masses.   Other findings   No abnormal free fluid.   IMPRESSION: 1. Normal appearance of the endometrium. If bleeding remains unresponsive to hormonal or medical therapy, sonohysterogram should be considered for focal lesion work-up. (Ref: Radiological Reasoning: Algorithmic Workup of Abnormal Vaginal Bleeding with Endovaginal Sonography and Sonohysterography. AJR 2008; [id number]) 2. Multiple ovarian cysts. Most significant: 2.5 cm left ovarian follicle, normal finding for premenopausal patient (benign simple cyst for postmenopausal patient). No follow-up imaging is recommended.   Assessment:   1. Abnormal menses   2. PCOS (polycystic ovarian syndrome)   3. Acquired hypothyroidism   4. Perimenopausal   5. Morbid obesity ([place])      Plan:   Patient has abnormal menses x 1 episode.  She has a normal ultrasound, no evidence of lesions.  Discussed etiology of abnormal bleeding including likely perimenopausal status, history of PCOS on long-term progesterone OCPs, increased adiposity, and history of hypothyroidism.  Reports that since stopping her OCPs her bleeding has stopped.  I discussed that she has the option of resuming her OCPs after her next cycle.  If cycle does not occur within the next 30 to 35 days, can also resume at that point in time.  Not currently sexually active so no concerns for pregnancy at time of resumption of OCPs.  Also discussed alternative management options if she continues to have the same response to her pills in subsequent months.  Additionally I discussed that if abnormal uterine bleeding continues, we will proceed with further evaluation with endometrial biopsy at that time as patient does have risk factors. History of PCOS, patient with bilateral simple small ovarian cysts versus follicles.  Reviewed this with patient.  Discussed that there was no concerns at this time.  No follow-up warranted.   History of hypothyroidism, patient reports that she is well controlled.  No issues at this time.  Would not likely because of abnormal bleeding controlled.  RTC in 2 to  3 months to follow-up with likely perimenopausal bleeding.  A total of 25 minutes were spent face-to-face with the patient during this encounter and over half of that time involved counseling and coordination of care.  Rubie Maid, MD [place]

## 2022-05-19 ENCOUNTER — Ambulatory Visit: Payer: No Typology Code available for payment source | Admitting: Obstetrics and Gynecology

## 2022-08-30 ENCOUNTER — Telehealth: Payer: Self-pay

## 2022-08-30 ENCOUNTER — Other Ambulatory Visit: Payer: Self-pay

## 2022-08-30 DIAGNOSIS — Z3041 Encounter for surveillance of contraceptive pills: Secondary | ICD-10-CM

## 2022-08-30 MED ORDER — NORETHINDRONE 0.35 MG PO TABS: 1.0000 | ORAL_TABLET | Freq: Every day | ORAL | 0 refills | Status: DC

## 2022-08-30 NOTE — Telephone Encounter (Signed)
Called Sarah Lozano regarding her birth control. Left voicemail

## 2022-08-30 NOTE — Telephone Encounter (Signed)
[name] returned my call, She needed the Sarah Lozano refilled to hold her over until she see Dr. Valentino Saxon on 09/15/22 I sent in one refill and told her to please keep that appointment if she wants more refills.

## 2022-09-08 ENCOUNTER — Ambulatory Visit: Payer: No Typology Code available for payment source | Admitting: Obstetrics and Gynecology

## 2022-09-15 ENCOUNTER — Encounter: Payer: Self-pay | Admitting: Obstetrics and Gynecology

## 2022-09-15 ENCOUNTER — Ambulatory Visit (INDEPENDENT_AMBULATORY_CARE_PROVIDER_SITE_OTHER): Payer: No Typology Code available for payment source | Admitting: Obstetrics and Gynecology

## 2022-09-15 VITALS — BP 127/76 | HR 66 | Resp 16 | Ht 69.0 in | Wt 341.4 lb

## 2022-09-15 DIAGNOSIS — Z01419 Encounter for gynecological examination (general) (routine) without abnormal findings: Secondary | ICD-10-CM

## 2022-09-15 DIAGNOSIS — E282 Polycystic ovarian syndrome: Secondary | ICD-10-CM

## 2022-09-15 DIAGNOSIS — N393 Stress incontinence (female) (male): Secondary | ICD-10-CM

## 2022-09-15 DIAGNOSIS — E039 Hypothyroidism, unspecified: Secondary | ICD-10-CM

## 2022-09-15 DIAGNOSIS — Z1231 Encounter for screening mammogram for malignant neoplasm of breast: Secondary | ICD-10-CM

## 2022-09-15 DIAGNOSIS — I10 Essential (primary) hypertension: Secondary | ICD-10-CM

## 2022-09-15 NOTE — Patient Instructions (Signed)
Preventive Care 50-50 Years Old, Female Preventive care refers to lifestyle choices and visits with your health care provider that can promote health and wellness. Preventive care visits are also called wellness exams. What can I expect for my preventive care visit? Counseling Your health care provider may ask you questions about your: Medical history, including: Past medical problems. Family medical history. Pregnancy history. Current health, including: Menstrual cycle. Method of birth control. Emotional well-being. Home life and relationship well-being. Sexual activity and sexual health. Lifestyle, including: Alcohol, nicotine or tobacco, and drug use. Access to firearms. Diet, exercise, and sleep habits. Work and work Astronomer. Sunscreen use. Safety issues such as seatbelt and bike helmet use. Physical exam Your health care provider will check your: Height and weight. These may be used to calculate your BMI (body mass index). BMI is a measurement that tells if you are at a healthy weight. Waist circumference. This measures the distance around your waistline. This measurement also tells if you are at a healthy weight and may help predict your risk of certain diseases, such as type 2 diabetes and high blood pressure. Heart rate and blood pressure. Body temperature. Skin for abnormal spots. What immunizations do I need?  Vaccines are usually given at various ages, according to a schedule. Your health care provider will recommend vaccines for you based on your age, medical history, and lifestyle or other factors, such as travel or where you work. What tests do I need? Screening Your health care provider may recommend screening tests for certain conditions. This may include: Lipid and cholesterol levels. Diabetes screening. This is done by checking your blood sugar (glucose) after you have not eaten for a while (fasting). Pelvic exam and Pap test. Hepatitis B test. Hepatitis C  test. HIV (human immunodeficiency virus) test. STI (sexually transmitted infection) testing, if you are at risk. Lung cancer screening. Colorectal cancer screening. Mammogram. Talk with your health care provider about when you should start having regular mammograms. This may depend on whether you have a family history of breast cancer. BRCA-related cancer screening. This may be done if you have a family history of breast, ovarian, tubal, or peritoneal cancers. Bone density scan. This is done to screen for osteoporosis. Talk with your health care provider about your test results, treatment options, and if necessary, the need for more tests. Follow these instructions at home: Eating and drinking  Eat a diet that includes fresh fruits and vegetables, whole grains, lean protein, and low-fat dairy products. Take vitamin and mineral supplements as recommended by your health care provider. Do not drink alcohol if: Your health care provider tells you not to drink. You are pregnant, may be pregnant, or are planning to become pregnant. If you drink alcohol: Limit how much you have to 0-1 drink a day. Know how much alcohol is in your drink. In the [place] one drink equals one 12 oz bottle of beer (355 mL), one 5 oz glass of wine (148 mL), or one 1 oz glass of hard liquor (44 mL). Lifestyle Brush your teeth every morning and night with fluoride toothpaste. Floss one time each day. Exercise for at least 30 minutes 5 or more days each week. Do not use any products that contain nicotine or tobacco. These products include cigarettes, chewing tobacco, and vaping devices, such as e-cigarettes. If you need help quitting, ask your health care provider. Do not use drugs. If you are sexually active, practice safe sex. Use a condom or other form of protection to  prevent STIs. If you do not wish to become pregnant, use a form of birth control. If you plan to become pregnant, see your health care provider for a  prepregnancy visit. Take aspirin only as told by your health care provider. Make sure that you understand how much to take and what form to take. Work with your health care provider to find out whether it is safe and beneficial for you to take aspirin daily. Find healthy ways to manage stress, such as: Meditation, yoga, or listening to music. Journaling. Talking to a trusted person. Spending time with friends and family. Minimize exposure to UV radiation to reduce your risk of skin cancer. Safety Always wear your seat belt while driving or riding in a vehicle. Do not drive: If you have been drinking alcohol. Do not ride with someone who has been drinking. When you are tired or distracted. While texting. If you have been using any mind-altering substances or drugs. Wear a helmet and other protective equipment during sports activities. If you have firearms in your house, make sure you follow all gun safety procedures. Seek help if you have been physically or sexually abused. What's next? Visit your health care provider once a year for an annual wellness visit. Ask your health care provider how often you should have your eyes and teeth checked. Stay up to date on all vaccines. This information is not intended to replace advice given to you by your health care provider. Make sure you discuss any questions you have with your health care provider. Document Revised: 10/01/2020 Document Reviewed: 10/01/2020 Elsevier Patient Education  2024 Elsevier Inc. Breast Self-Awareness Breast self-awareness is knowing how your breasts look and feel. You need to: Check your breasts on a regular basis. Tell your doctor about any changes. Become familiar with the look and feel of your breasts. This can help you catch a breast problem while it is still small and can be treated. You should do breast self-exams even if you have breast implants. What you need: A mirror. A well-lit room. A pillow or other  soft object. How to do a breast self-exam Follow these steps to do a breast self-exam: Look for changes  Take off all the clothes above your waist. Stand in front of a mirror in a room with good lighting. Put your hands down at your sides. Compare your breasts in the mirror. Look for any difference between them, such as: A difference in shape. A difference in size. Wrinkles, dips, and bumps in one breast and not the other. Look at each breast for changes in the skin, such as: Redness. Scaly areas. Skin that has gotten thicker. Dimpling. Open sores (ulcers). Look for changes in your nipples, such as: Fluid coming out of a nipple. Fluid around a nipple. Bleeding. Dimpling. Redness. A nipple that looks pushed in (retracted), or that has changed position. Feel for changes Lie on your back. Feel each breast. To do this: Pick a breast to feel. Place a pillow under the shoulder closest to that breast. Put the arm closest to that breast behind your head. Feel the nipple area of that breast using the hand of your other arm. Feel the area with the pads of your three middle fingers by making small circles with your fingers. Use light, medium, and firm pressure. Continue the overlapping circles, moving downward over the breast. Keep making circles with your fingers. Stop when you feel your ribs. Start making circles with your fingers again, this time going  upward until you reach your collarbone. Then, make circles outward across your breast and into your armpit area. Squeeze your nipple. Check for discharge and lumps. Repeat these steps to check your other breast. Sit or stand in the tub or shower. With soapy water on your skin, feel each breast the same way you did when you were lying down. Write down what you find Writing down what you find can help you remember what to tell your doctor. Write down: What is normal for each breast. Any changes you find in each breast. These  include: The kind of changes you find. A tender or painful breast. Any lump you find. Write down its size and where it is. When you last had your monthly period (menstrual cycle). General tips If you are breastfeeding, the best time to check your breasts is after you feed your baby or after you use a breast pump. If you get monthly bleeding, the best time to check your breasts is 5-7 days after your monthly cycle ends. With time, you will become comfortable with the self-exam. You will also start to know if there are changes in your breasts. Contact a doctor if: You see a change in the shape or size of your breasts or nipples. You see a change in the skin of your breast or nipples, such as red or scaly skin. You have fluid coming from your nipples that is not normal. You find a new lump or thick area. You have breast pain. You have any concerns about your breast health. Summary Breast self-awareness includes looking for changes in your breasts and feeling for changes within your breasts. You should do breast self-awareness in front of a mirror in a well-lit room. If you get monthly periods (menstrual cycles), the best time to check your breasts is 5-7 days after your period ends. Tell your doctor about any changes you see in your breasts. Changes include changes in size, changes on the skin, painful or tender breasts, or fluid from your nipples that is not normal. This information is not intended to replace advice given to you by your health care provider. Make sure you discuss any questions you have with your health care provider. Document Revised: 09/10/2021 Document Reviewed: 02/05/2021 Elsevier Patient Education  2024 [employer].

## 2022-09-15 NOTE — Progress Notes (Signed)
GYNECOLOGY ANNUAL PHYSICAL EXAM PROGRESS NOTE  Subjective:    Sarah Lozano is a 52 y.o. G2P1001 female who presents for an annual exam.  The patient is not sexually active. The patient participates in regular exercise: yes. Has the patient ever been transfused or tattooed?: yes. The patient reports that there is not domestic violence in her life.   The patient has no complaints today.  Notes that she is having issues with urinary leakage.  She reports this has been an issue since after childbirth, however worsened over the past few years.  Mostly noted with doing exercising, but also can happen even at rest. Empties her bladder regularly. Does Kegel exercises.  Does note that she drinks sodas with meals, drinks ~ 100 ounces of water per day.   Reports that she is no longer having cycles. Currently on Micronor.  Menstrual History: Menarche age: 67 Patient's last menstrual period was 07/04/2022. Period Cycle (Days): 28 Period Duration (Days): 2-4 Period Pattern: (!) Irregular Menstrual Flow: Light Menstrual Control: Maxi pad Menstrual Control Change Freq (Hours): 12 Dysmenorrhea: None     Gynecologic History:  Contraception: oral progesterone-only contraceptive History of STI's:  Last Pap: 07/29/2021. Results were: normal.  Denies h/o abnormal pap smears. Last mammogram: 08/27/2021. Results were: normal   OB History  Gravida Para Term Preterm AB Living  1 1 1  0 0 1  SAB IAB Ectopic Multiple Live Births  0 0 0 0 1    # Outcome Date GA Lbr Len/2nd Weight Sex Delivery Anes PTL Lv  1 Term 02/09/00   8 lb 14 oz (4.026 kg) M Vag-Spont   LIV     Name: Jonny Ruiz (JT)    Past Medical History:  Diagnosis Date   Anxiety    Arthritis    Depression    Fracture of 4th metatarsal 6/20/209   Left   Fracture of 5th metatarsal 10/06/2017   Left   Gonorrhea 11/2013   Hypertension    Hypothyroidism    Morbid obesity with BMI of 50.0-59.9, adult (HCC)    Pelvic floor dysfunction     in complete emptying of bowel   Polycystic ovarian syndrome    insulin resistent    Past Surgical History:  Procedure Laterality Date   BUNIONECTOMY Right 01/2009   COLONOSCOPY  12/12/2015   rectal bleeding, one polyp and internal hemorrhoid   COLONOSCOPY W/ BIOPSIES  01/25/2019   hyperplastic and adenomatous polyps removed-next colonoscopy due in 5 years   COSMETIC SURGERY  1975   lip laceration was repaired   DILATION AND CURETTAGE OF UTERUS  2000   HYSTEROSCOPY WITH D & C N/A 08/29/2015   Procedure: DILATATION AND CURETTAGE /HYSTEROSCOPY;  Surgeon: Nadara Mustard, MD;  Location: [facility];  Service: Gynecology;  Laterality: N/A;   WISDOM TOOTH EXTRACTION      Family History  Problem Relation Age of Onset   Hypertension Father    Prostate cancer Father 53   Heart failure Maternal Grandmother    Hypertension Maternal Grandmother    Alzheimer's disease Maternal Grandmother    Heart failure Maternal Grandfather    Hypertension Maternal Grandfather    Diabetes Paternal Grandfather    Heart failure Paternal Grandfather    Hypertension Paternal Grandfather    Prostate cancer Paternal Grandfather    Hypertension Paternal Grandmother    Breast cancer Neg Hx     Social History   Socioeconomic History   Marital status: Married    Spouse name:  Not on file   Number of children: 1   Years of education: Not on file   Highest education level: Master's degree (e.g., MA, MS, MEng, MEd, MSW, MBA)  Occupational History   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  Tobacco Use   Smoking status: Former    Types: Cigarettes    Quit date: 12/22/2008    Years since quitting: 13.7   Smokeless tobacco: Never  Vaping Use   Vaping Use: Never used  Substance and Sexual Activity   Alcohol use: Yes    Comment: rare   Drug use: No   Sexual activity: Yes    Birth control/protection: Pill  Other Topics Concern   Not on file  Social History Narrative   Not on file   Social Determinants of  Health   Financial Resource Strain: Not on file  Food Insecurity: Not on file  Transportation Needs: Not on file  Physical Activity: Inactive (06/14/2018)   Exercise Vital Sign    Days of Exercise per Week: 0 days    Minutes of Exercise per Session: 0 min  Stress: Stress Concern Present (06/14/2018)   [employer] of Occupational Health - Occupational Stress Questionnaire    Feeling of Stress : To some extent  Social Connections: Moderately Integrated (06/14/2018)   Social Connection and Isolation Panel [NHANES]    Frequency of Communication with Friends and Family: More than three times a week    Frequency of Social Gatherings with Friends and Family: Twice a week    Attends Religious Services: More than 4 times per year    Active Member of [employer] or Organizations: No    Attends Banker Meetings: Never    Marital Status: Married  Catering manager Violence: Not At Risk (06/14/2018)   Humiliation, Afraid, Rape, and Kick questionnaire    Fear of Current or Ex-Partner: No    Emotionally Abused: No    Physically Abused: No    Sexually Abused: No    Current Outpatient Medications on File Prior to Visit  Medication Sig Dispense Refill   b complex vitamins capsule Take 1 capsule by mouth at bedtime.     Calcium-Magnesium-Vitamin D (CALCIUM 500 PO) Take 1 tablet by mouth daily.     calcium-vitamin D (OSCAL WITH D) 500-200 MG-UNIT TABS tablet Take 1 tablet by mouth daily.     cetirizine (ZYRTEC) 10 MG tablet Take 10 mg by mouth at bedtime.     citalopram (CELEXA) 40 MG tablet Take 40 mg by mouth daily.     cyclobenzaprine (FLEXERIL) 10 MG tablet Take 1 tablet by mouth as needed.     ibuprofen (ADVIL,MOTRIN) 800 MG tablet Take by mouth.     LORazepam (ATIVAN) 0.5 MG tablet Take 1 tablet by mouth as needed.     losartan (COZAAR) 100 MG tablet Take 1 tablet by mouth daily.     meloxicam (MOBIC) 15 MG tablet Take 1 tablet by mouth as needed.     montelukast (SINGULAIR) 10 MG  tablet Take 10 mg by mouth at bedtime.     norethindrone (CAMILA) 0.35 MG tablet Take 1 tablet (0.35 mg total) by mouth daily. 30 tablet 0   Semaglutide, 1 MG/DOSE, (OZEMPIC, 1 MG/DOSE,) 4 MG/3ML SOPN Ozempic 1 mg/dose (4 mg/3 mL) subcutaneous pen injector     Semaglutide,0.25 or 0.5MG /DOS, 2 MG/3ML SOPN Inject into the skin.     No current facility-administered medications on file prior to visit.    Allergies  Allergen Reactions  Lisinopril Cough   Metformin And Related Swelling    Swelling of joints    Vistaril [Hydroxyzine Hcl] Other (See Comments)    "hyper"     Review of Systems Constitutional: negative for chills, fatigue, fevers and sweats Eyes: negative for irritation, redness and visual disturbance Ears, nose, mouth, throat, and face: negative for hearing loss, nasal congestion, snoring and tinnitus Respiratory: negative for asthma, cough, sputum Cardiovascular: negative for chest pain, dyspnea, exertional chest pressure/discomfort, irregular heart beat, palpitations and syncope Gastrointestinal: negative for abdominal pain, change in bowel habits, nausea and vomiting Genitourinary: negative for abnormal menstrual periods, genital lesions, sexual problems and vaginal discharge, dysuria.  Positive for urinary incontinence (see HPI) Integument/breast: negative for breast lump, breast tenderness and nipple discharge Hematologic/lymphatic: negative for bleeding and easy bruising Musculoskeletal:negative for back pain and muscle weakness Neurological: negative for dizziness, headaches, vertigo and weakness Endocrine: negative for diabetic symptoms including polydipsia, polyuria and skin dryness Allergic/Immunologic: negative for hay fever and urticaria      Objective:  Blood pressure 127/76, pulse 66, resp. rate 16, height 5\' 9"  (1.753 m), weight (!) 341 lb 6.4 oz (154.9 kg), last menstrual period 07/04/2022. Body mass index is 50.42 kg/m.    General Appearance:    Alert,  cooperative, no distress, appears stated age, morbid obesity  Head:    Normocephalic, without obvious abnormality, atraumatic  Eyes:    PERRL, conjunctiva/corneas clear, EOM's intact, both eyes  Ears:    Normal external ear canals, both ears  Nose:   Nares normal, septum midline, mucosa normal, no drainage or sinus tenderness  Throat:   Lips, mucosa, and tongue normal; teeth and gums normal  Neck:   Supple, symmetrical, trachea midline, no adenopathy; thyroid: no enlargement/tenderness/nodules; no carotid bruit or JVD  Back:     Symmetric, no curvature, ROM normal, no CVA tenderness  Lungs:     Clear to auscultation bilaterally, respirations unlabored  Chest Wall:    No tenderness or deformity   Heart:    Regular rate and rhythm, S1 and S2 normal, no murmur, rub or gallop  Breast Exam:    No tenderness, masses, or nipple abnormality  Abdomen:     Soft, non-tender, bowel sounds active all four quadrants, no masses, no organomegaly.    Genitalia:    Pelvic:external genitalia normal, vagina without lesions, discharge, or tenderness, rectovaginal septum  normal. Cervix normal in appearance, no cervical motion tenderness, no adnexal masses or tenderness.  Uterus normal size, shape, mobile, regular contours, nontender.  Rectal:    Normal external sphincter.  No hemorrhoids appreciated. Internal exam not done.   Extremities:   Extremities normal, atraumatic, no cyanosis or edema  Pulses:   2+ and symmetric all extremities  Skin:   Skin color, texture, turgor normal, no rashes or lesions  Lymph nodes:   Cervical, supraclavicular, and axillary nodes normal  Neurologic:   CNII-XII intact, normal strength, sensation and reflexes throughout   .  Labs:  Lab Results  Component Value Date   WBC 10.6 08/20/2015   HGB 12.7 08/20/2015   HCT 38.1 08/20/2015   MCV 82.9 08/20/2015   PLT 254 08/20/2015    No results found for: "CREATININE", "BUN", "NA", "K", "CL", "CO2"  No results found for: "ALT",  "AST", "GGT", "ALKPHOS", "BILITOT"  No results found for: "TSH"   Assessment:   1. Well woman exam with routine gynecological exam   2. Encounter for screening mammogram for malignant neoplasm of breast   3. Essential  hypertension   4. Acquired hypothyroidism   5. PCOS (polycystic ovarian syndrome)   6. Morbid obesity with BMI of 50.0-59.9, adult (HCC)   7. Stress incontinence in female      Plan:  - Blood tests: Labs up-to-date by PCP.  Notes she had also a recent lipid panel performed several months ago through her employer. - Breast self exam technique reviewed and patient encouraged to perform self-exam monthly. - Contraception: oral progesterone-only contraceptive.  Discussed that at this time patient may be menopausal and may no longer need to be on birth control, however her amenorrhea could also be caused by the birth control itself.  I discussed weaning from the birth control for a month or 2 to see if her menses resumes.  If it does she can continue on the birth control.  If it does not then she most likely has entered menopause.  Patient concerned about her PCOS.  I discussed that her PCOS usually is not a problem if she is in menopause.  - Discussed healthy lifestyle modifications. - Mammogram ordered - Pap smear  UTD . -Discussed management of her stress incontinence, including lifestyle modifications, pelvic floor physical therapy (which patient notes that she tried in the past but failed), pessary use and surgical intervention with sling.  I discussed that due to her obesity she may only get some relief from surgical intervention unless she could lower her weight.  Patient notes that she is currently working on weight loss, was recently started on Mounjaro.  Also notes overall trying to eat a healthy diet however still indulges in carbohydrates fairly regularly.  Also exercises regularly as well.  After discussion of all of her options, patient is thinking about use of a pessary  at this time.  Given handout to review.  If pessary desired can schedule future appointment for pessary fitting. -Hypertension, hypothyroidism managed by PCP.   - Follow up in 1 year for annual exam   Hildred Laser, MD [facility]

## 2022-09-21 ENCOUNTER — Other Ambulatory Visit: Payer: Self-pay | Admitting: Obstetrics and Gynecology

## 2022-09-21 DIAGNOSIS — Z3041 Encounter for surveillance of contraceptive pills: Secondary | ICD-10-CM

## 2022-11-03 ENCOUNTER — Ambulatory Visit
Admission: RE | Admit: 2022-11-03 | Discharge: 2022-11-03 | Disposition: A | Discharge status: Home / Self Care | Payer: No Typology Code available for payment source | Source: Ambulatory Visit | Attending: Obstetrics and Gynecology | Admitting: Obstetrics and Gynecology

## 2022-11-03 ENCOUNTER — Encounter: Payer: Self-pay | Admitting: Radiology

## 2022-11-03 DIAGNOSIS — Z01419 Encounter for gynecological examination (general) (routine) without abnormal findings: Secondary | ICD-10-CM | POA: Insufficient documentation

## 2022-11-03 DIAGNOSIS — Z1231 Encounter for screening mammogram for malignant neoplasm of breast: Secondary | ICD-10-CM | POA: Diagnosis not present

## 2023-09-16 ENCOUNTER — Ambulatory Visit (INDEPENDENT_AMBULATORY_CARE_PROVIDER_SITE_OTHER)

## 2023-09-16 VITALS — BP 136/80 | HR 70 | Ht 67.0 in | Wt 336.9 lb

## 2023-09-16 DIAGNOSIS — Z1231 Encounter for screening mammogram for malignant neoplasm of breast: Secondary | ICD-10-CM

## 2023-09-16 DIAGNOSIS — Z01419 Encounter for gynecological examination (general) (routine) without abnormal findings: Secondary | ICD-10-CM

## 2023-09-16 DIAGNOSIS — N951 Menopausal and female climacteric states: Secondary | ICD-10-CM

## 2023-09-16 MED ORDER — VEOZAH 45 MG PO TABS: 1.0000 | ORAL_TABLET | Freq: Every day | ORAL | 11 refills | Status: AC

## 2023-09-16 NOTE — Progress Notes (Signed)
 ANNUAL GYNECOLOGICAL EXAM  SUBJECTIVE  HPI  Sarah Lozano is a 53 y.o.-year-old G1P1001 who presents for an annual gynecological exam today.  She denies pelvic pain, dyspareunia, abnormal vaginal bleeding or discharge, and UTI symptoms. She has had increased urination and thirst for the past three months, along with increase hot flashes that wake her up at night.   Medical/Surgical History Past Medical History:  Diagnosis Date   Anxiety    Arthritis    Depression    Fracture of 4th metatarsal 6/20/209   Left   Fracture of 5th metatarsal 10/06/2017   Left   Gonorrhea 11/2013   Hypertension    Hypothyroidism    Morbid obesity with BMI of 50.0-59.9, adult (HCC)    Pelvic floor dysfunction    in complete emptying of bowel   Polycystic ovarian syndrome    insulin resistent   Past Surgical History:  Procedure Laterality Date   BUNIONECTOMY Right 01/2009   COLONOSCOPY  12/12/2015   rectal bleeding, one polyp and internal hemorrhoid   COLONOSCOPY W/ BIOPSIES  01/25/2019   hyperplastic and adenomatous polyps removed-next colonoscopy due in 5 years   COSMETIC SURGERY  1975   lip laceration was repaired   DILATION AND CURETTAGE OF UTERUS  2000   HYSTEROSCOPY WITH D & C N/A 08/29/2015   Procedure: DILATATION AND CURETTAGE /HYSTEROSCOPY;  Surgeon: Alben Alma, MD;  Location: [facility];  Service: Gynecology;  Laterality: N/A;   WISDOM TOOTH EXTRACTION      Social History Lives with Husband. Feels safe there Work: Pharmacist, hospital  Exercise: 7x a week, with a Systems analyst  Substances: Denies EtOH, tobacco, vape, and recreational drugs  Obstetric History OB History     Gravida  1   Para  1   Term  1   Preterm      AB      Living  1      SAB      IAB      Ectopic      Multiple      Live Births  1            GYN/Menstrual History Patient's last menstrual period was 09/06/2023 (exact date). regular periods every 28 days Last Pap:  07/29/21 Contraception: none   Prevention Eye exam: January  Mammogram: Referral sent  Colonoscopy due   Current Medications Outpatient Medications Prior to Visit  Medication Sig Note   b complex vitamins capsule Take 1 capsule by mouth at bedtime.    buPROPion (WELLBUTRIN XL) 150 MG 24 hr tablet Take 150 mg by mouth.    Calcium-Magnesium-Vitamin D (CALCIUM 500 PO) Take 1 tablet by mouth daily.    calcium-vitamin D (OSCAL WITH D) 500-200 MG-UNIT TABS tablet Take 1 tablet by mouth daily.    cetirizine (ZYRTEC) 10 MG tablet Take 10 mg by mouth at bedtime.    citalopram (CELEXA) 40 MG tablet Take 40 mg by mouth daily.    cyclobenzaprine (FLEXERIL) 10 MG tablet Take 1 tablet by mouth as needed.    fluticasone (FLONASE) 50 MCG/ACT nasal spray SHAKE LIQUID AND USE 2 SPRAYS IN EACH NOSTRIL EVERY DAY    ibuprofen (ADVIL,MOTRIN) 800 MG tablet Take by mouth.    LORazepam (ATIVAN) 0.5 MG tablet Take 1 tablet by mouth as needed. 09/16/2023: prn   losartan (COZAAR) 100 MG tablet Take 1 tablet by mouth daily. 09/16/2023: PATIENT reports taking 50mg    meloxicam (MOBIC) 15 MG tablet Take 1  tablet by mouth as needed.    montelukast (SINGULAIR) 10 MG tablet Take 10 mg by mouth at bedtime.    nystatin (MYCOSTATIN/NYSTOP) powder APPLY TOPICALLY TO THE AFFECTED AREA TWICE DAILY    phentermine (ADIPEX-P) 37.5 MG tablet Take 37.5 mg by mouth daily.    LORazepam (ATIVAN) 0.5 MG tablet     [DISCONTINUED] norethindrone  (CAMILA ) 0.35 MG tablet Take 1 tablet (0.35 mg total) by mouth daily.    [DISCONTINUED] Semaglutide,0.25 or 0.5MG /DOS, 2 MG/3ML SOPN Inject into the skin.    No facility-administered medications prior to visit.        ROS Constitutional: Denied constitutional symptoms, night sweats, recent illness, fatigue, fever, and weight loss. Endorses Insomnia   Eyes: Denied eye symptoms, eye pain, photophobia, vision change and visual disturbance.  Ears/Nose/Throat/Neck: Denied ear, nose, throat or  neck symptoms, hearing loss, nasal discharge, sinus congestion and sore throat.  Cardiovascular: Denied cardiovascular symptoms, arrhythmia, chest pain/pressure, edema, exercise intolerance, orthopnea and palpitations.  Respiratory: Denied pulmonary symptoms, asthma, pleuritic pain, productive sputum, cough, dyspnea and wheezing.  Gastrointestinal: Denied gastro-esophageal reflux, melena, nausea and vomiting.  Genitourinary: Denied genitourinary symptoms including symptomatic vaginal discharge, pelvic relaxation issues. Endorses urinary incontinence, increase urinary frequency   Musculoskeletal: Denied musculoskeletal symptoms, stiffness, swelling, muscle weakness and myalgia.  Dermatologic: Denied dermatology symptoms, rash and scar.  Neurologic: Denied neurology symptoms, dizziness, headache, neck pain and syncope.  Psychiatric: Denied psychiatric symptoms, anxiety and depression.  Endocrine: Endorses endocrine symptoms including hot flashes and night sweats.    OBJECTIVE  BP 136/80   Pulse 70   Ht 5\' 7"  (1.702 m)   Wt (!) 152.8 kg   LMP 09/06/2023 (Exact Date)   BMI 52.77 kg/m    Physical examination General NAD, Conversant  HEENT Atraumatic; Op clear with mmm.  Normo-cephalic. Pupils reactive. Anicteric sclerae  Thyroid/Neck Smooth without nodularity or enlargement. Normal ROM.  Neck Supple.  Skin No rashes, lesions or ulceration. Normal palpated skin turgor. No nodularity.  Breasts: decined  Lungs: Clear to auscultation.No rales or wheezes. Normal Respiratory effort, no retractions.  Heart: NSR.  No murmurs or rubs appreciated. No peripheral edema  Abdomen: Soft.  Non-tender.  No masses.  No HSM. No hernia  Extremities: Moves all appropriately.  Normal ROM for age. No lymphadenopathy.  Neuro: Oriented to PPT.  Normal mood. Normal affect.     Pelvic: deffered  Vulva:   Vagina:   Support:   Urethra   Meatus   Cervix:   Anus:   Perineum:         Bimanual deferred   Uterus:   Adnexae:   Cul-de-sac:     ASSESSMENT  1) Annual exam 2) Hot flashes   PLAN 1) Physical exam as noted. Discussed healthy lifestyle choices and preventive care. 2) Started Veozah  45mg  daily for hot flashes- baseline liver enzymes collected today, check again in 1 and 3 months.  3)Follow by PCP for health maintenance including weight management, and other co-morbidities. 4) Hgba1c collected today, pt has not been previously screened for diabetes in the last several years  5) Reviewed ongoing urinary incontinence issues and reviewed previous discussion with DR. Cherry who suggests pessary placement, patient is unsure about this option and would need to schedule with a doctor to discuss further is she wants to pursue this option.   Return in one year for annual exam or as needed for concerns.   Rosealee Concha, SNM

## 2023-09-17 LAB — HEMOGLOBIN A1C
Est. average glucose Bld gHb Est-mCnc: 103 mg/dL
Hgb A1c MFr Bld: 5.2 % (ref 4.8–5.6)

## 2023-09-17 LAB — HEPATIC FUNCTION PANEL
ALT: 25 IU/L (ref 0–32)
AST: 18 IU/L (ref 0–40)
Albumin: 4.2 g/dL (ref 3.8–4.9)
Alkaline Phosphatase: 84 IU/L (ref 44–121)
Bilirubin Total: 0.3 mg/dL (ref 0.0–1.2)
Bilirubin, Direct: 0.11 mg/dL (ref 0.00–0.40)
Total Protein: 6.7 g/dL (ref 6.0–8.5)

## 2023-09-19 ENCOUNTER — Ambulatory Visit: Payer: Self-pay | Admitting: Certified Nurse Midwife

## 2023-09-19 ENCOUNTER — Telehealth: Payer: Self-pay

## 2023-09-19 NOTE — Telephone Encounter (Signed)
 Sarah Lozano

## 2023-09-20 ENCOUNTER — Telehealth: Payer: Self-pay

## 2023-09-20 NOTE — Telephone Encounter (Signed)
 Sarah Lozano

## 2023-09-20 NOTE — Telephone Encounter (Signed)
 Fax received from covered my meds in regards to additional information needed for Pre-Authorization for Veozah  45mg . Will review over fax sent in below and fill out and fax back to [facility] for review. KW

## 2023-09-21 NOTE — Telephone Encounter (Signed)
 Prior Authorization has been initiated on 06/04. EPA is [id number]. You will be receiving information on how to complete PA soon.

## 2023-09-22 NOTE — Telephone Encounter (Signed)
 PA question were received and placed on [employer] for update. I also sent this message to Abraham Hoffmann with questions attached.

## 2023-10-19 ENCOUNTER — Other Ambulatory Visit: Payer: Self-pay | Admitting: Family Medicine

## 2023-10-19 ENCOUNTER — Other Ambulatory Visit

## 2023-10-19 DIAGNOSIS — N951 Menopausal and female climacteric states: Secondary | ICD-10-CM

## 2023-10-19 DIAGNOSIS — Z1231 Encounter for screening mammogram for malignant neoplasm of breast: Secondary | ICD-10-CM

## 2023-10-19 NOTE — Telephone Encounter (Signed)
 Hi Jessica, can we assign patients veozah  prescription to you since Lauraine and Dr. Connell are gone? I saw you had sent a msg to pt regarding labs.

## 2023-10-20 LAB — HEPATIC FUNCTION PANEL
ALT: 13 IU/L (ref 0–32)
AST: 16 IU/L (ref 0–40)
Albumin: 3.9 g/dL (ref 3.8–4.9)
Alkaline Phosphatase: 72 IU/L (ref 44–121)
Bilirubin Total: <0.2 mg/dL (ref 0.0–1.2)
Bilirubin, Direct: 0.09 mg/dL (ref 0.00–0.40)
Total Protein: 6.4 g/dL (ref 6.0–8.5)

## 2023-10-24 NOTE — Telephone Encounter (Signed)
 PA submitted under Harlene [employer].

## 2023-11-01 NOTE — Telephone Encounter (Signed)
Denial

## 2023-11-08 ENCOUNTER — Ambulatory Visit
Admission: RE | Admit: 2023-11-08 | Discharge: 2023-11-08 | Disposition: A | Discharge status: Home / Self Care | Source: Ambulatory Visit | Attending: Family Medicine | Admitting: Family Medicine

## 2023-11-08 DIAGNOSIS — Z1231 Encounter for screening mammogram for malignant neoplasm of breast: Secondary | ICD-10-CM | POA: Diagnosis present

## 2023-11-09 ENCOUNTER — Other Ambulatory Visit: Payer: Self-pay | Admitting: Medical Genetics

## 2023-11-24 ENCOUNTER — Other Ambulatory Visit

## 2023-11-24 ENCOUNTER — Telehealth: Payer: Self-pay

## 2023-11-24 NOTE — Telephone Encounter (Signed)
 Received prior authorization request for Veozah .  Reached out to patient who says the insurance denied previous prior authorizations and she was approved by the Veozah  company for a savings card but it will only cover one more refill for her.  Advised another prior authorization will be denied since nothing has changed in her health or our notes.  The medication is greatly helping her.  Encouraged her to fill the Veozah  with the savings card and go ahead and make an appointment to discuss alternative treatments she could try that may help with future approval by the insurance company.

## 2023-12-07 ENCOUNTER — Other Ambulatory Visit

## 2023-12-14 IMAGING — MG MM DIGITAL DIAGNOSTIC UNILAT*R* W/ TOMO W/ CAD
8 series · 8 of 24 positions shown · non-contrast
Comparison: Previous exam(s).

CLINICAL DATA: 51-year-old female recalled from screening mammogram
dated 08/27/2021 for a possible right breast asymmetry.

EXAM:
DIGITAL DIAGNOSTIC UNILATERAL RIGHT MAMMOGRAM WITH TOMOSYNTHESIS AND
CAD; ULTRASOUND RIGHT BREAST LIMITED
TECHNIQUE: Right digital diagnostic mammography and breast tomosynthesis was
performed. The images were evaluated with computer-aided detection.;
Targeted ultrasound examination of the right breast was performed

[R XCCL synth-2D]
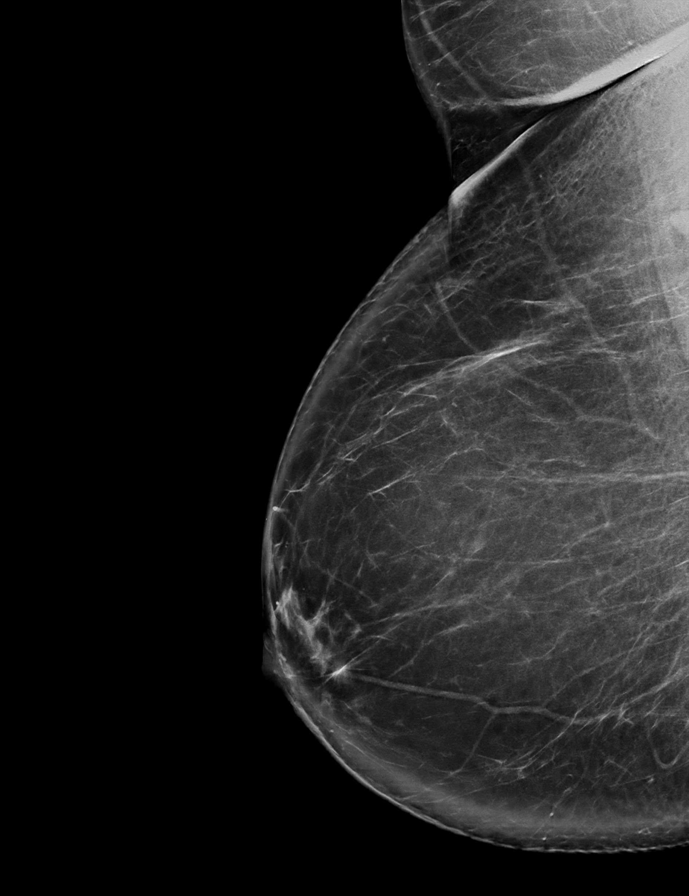

[R ML synth-2D (1 of 2)]
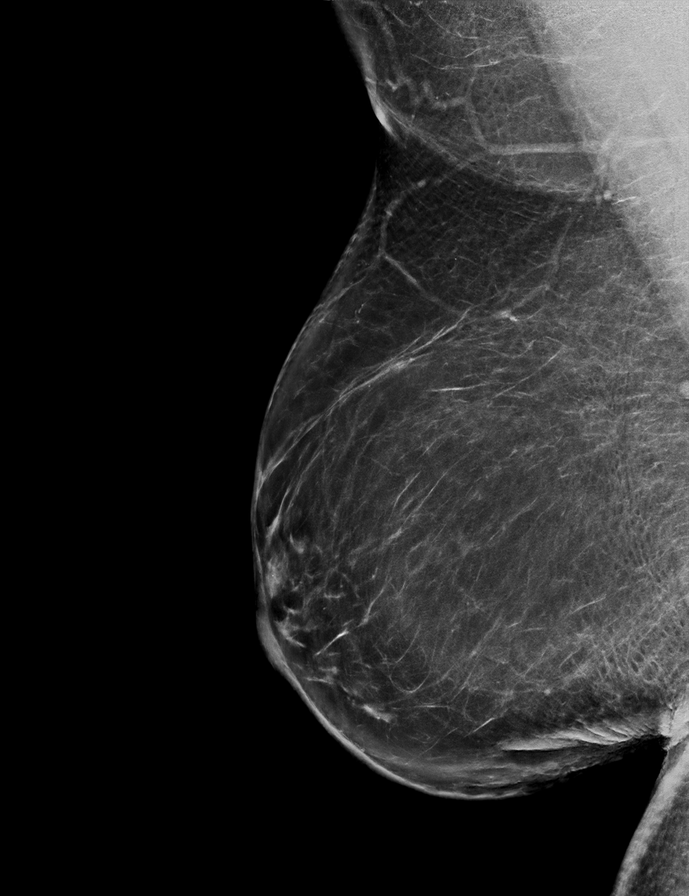

[R MLO synth-2D]
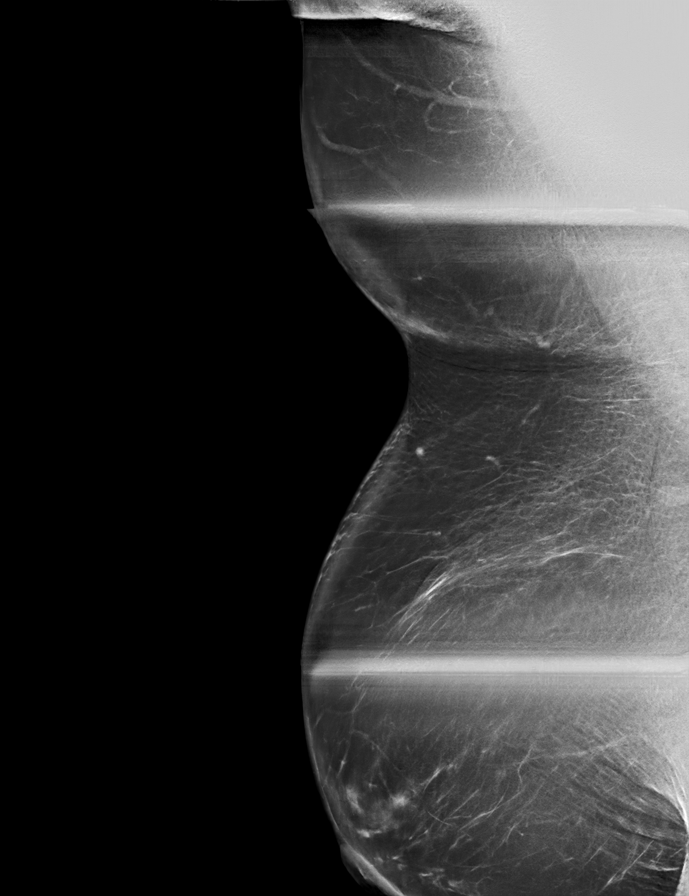

[R ML synth-2D (2 of 2)]
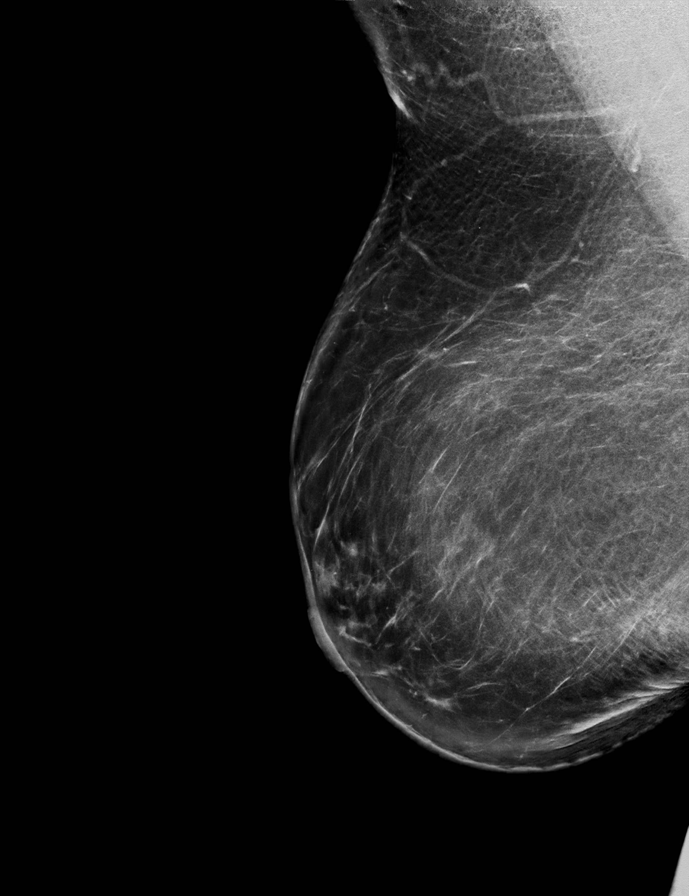

[R ML tomo (1 of 2) · tomo slice 43/86.0]
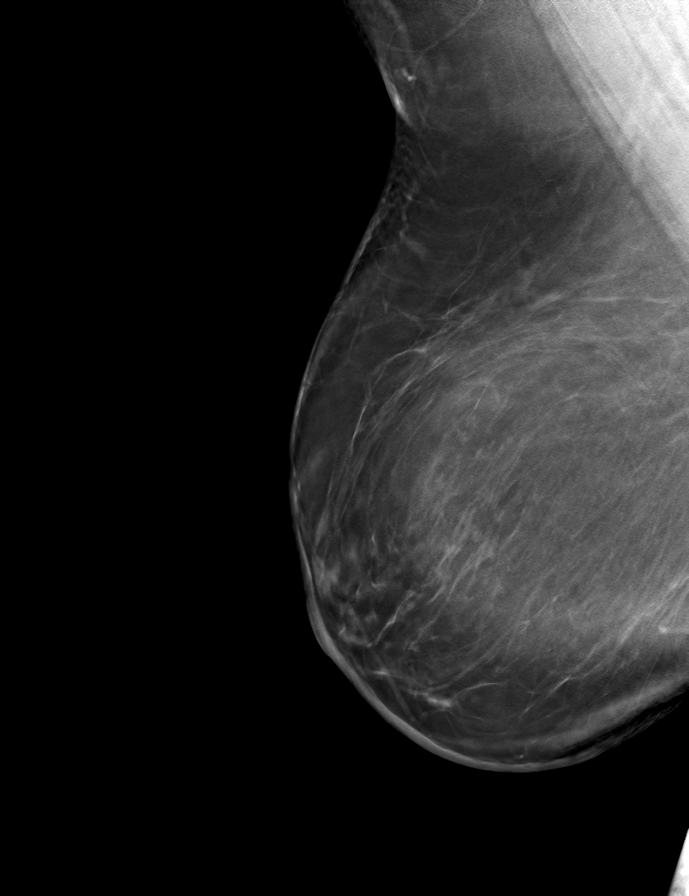

[R ML tomo (2 of 2) · tomo slice 46/91.0]
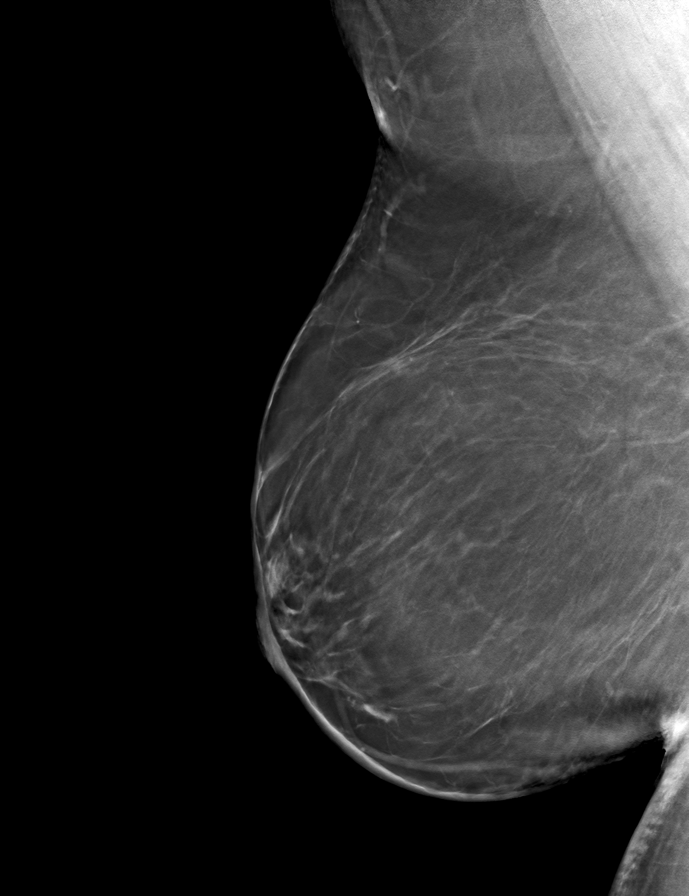

[R MLO tomo · tomo slice 49/97.0]
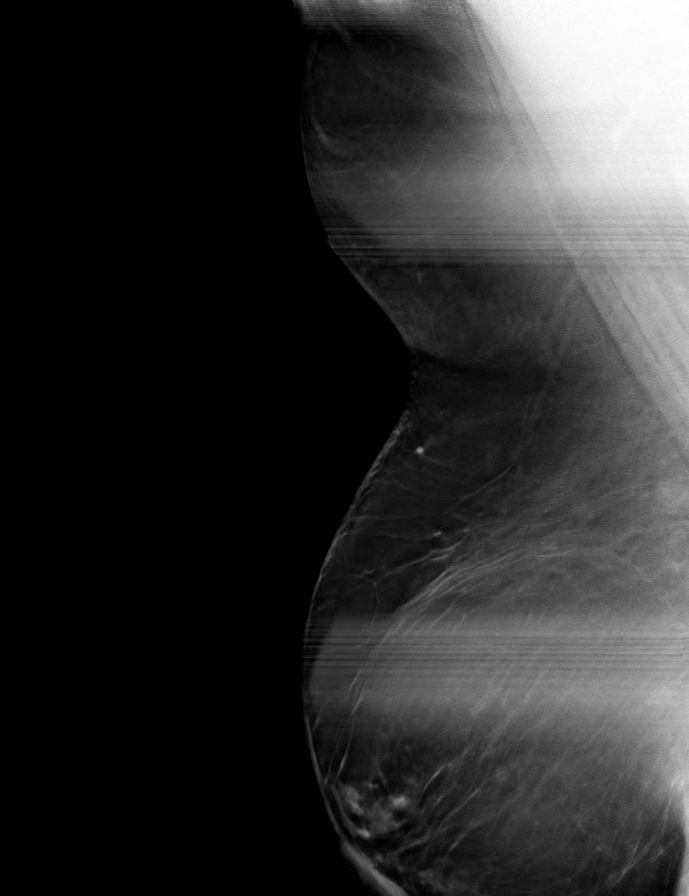

[R XCCL tomo · tomo slice 40/79.0]
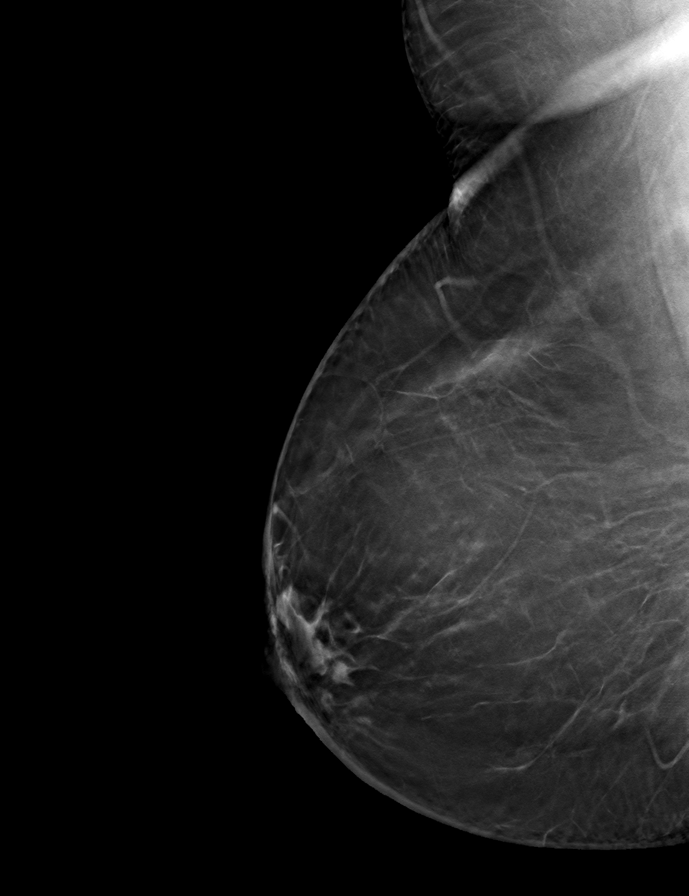

[8 of 24 positions shown; findings below may reference images not displayed]

ACR Breast Density Category b: There are scattered areas of
fibroglandular density.
FINDINGS: Previously described, possible asymmetry in the far posterior right
breast on the MLO projection persists as an oval, circumscribed
mass. The appearance is most suggestive of a low lying axillary
lymph node. Confirmatory ultrasound was performed.

Targeted ultrasound is performed, showing multiple, morphologically
normal lymph nodes along the deep [DATE] axis of the right breast. One
of these likely corresponds with the mammographic finding. No
suspicious findings identified.
IMPRESSION: Morphologically normal, low lying right axillary lymph node
corresponding with the screening mammographic findings. No further
follow-up required.

RECOMMENDATION:
Screening mammogram in one year.(Code:5A-5-6H6)

I have discussed the findings and recommendations with the patient.
If applicable, a reminder letter will be sent to the patient
regarding the next appointment.

BI-RADS CATEGORY  1: Negative.

## 2023-12-14 IMAGING — US US BREAST*R* LIMITED INC AXILLA
1 series · 6 of 6 positions shown · non-contrast
Comparison: Previous exam(s).

CLINICAL DATA: 51-year-old female recalled from screening mammogram
dated 08/27/2021 for a possible right breast asymmetry.

EXAM:
DIGITAL DIAGNOSTIC UNILATERAL RIGHT MAMMOGRAM WITH TOMOSYNTHESIS AND
CAD; ULTRASOUND RIGHT BREAST LIMITED
TECHNIQUE: Right digital diagnostic mammography and breast tomosynthesis was
performed. The images were evaluated with computer-aided detection.;
Targeted ultrasound examination of the right breast was performed

[Series 1: us breast*right* limited inc axilla · 0.08mm/px · 6 of 6 slices shown]
[im 1/6]
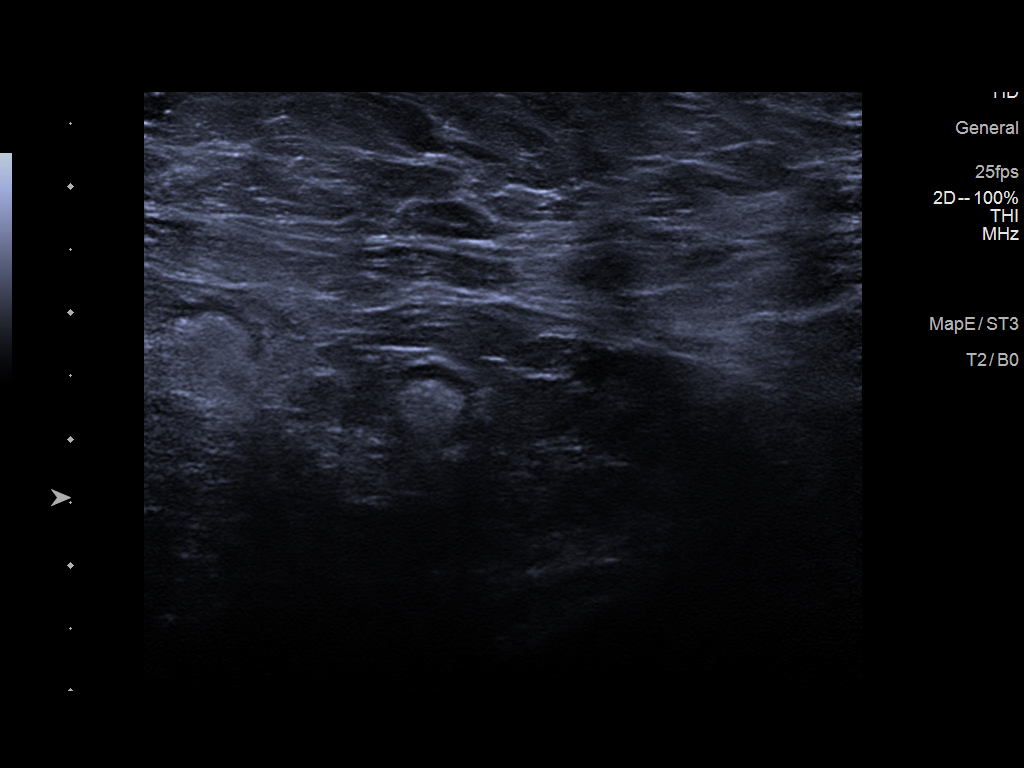
[im 2/6]
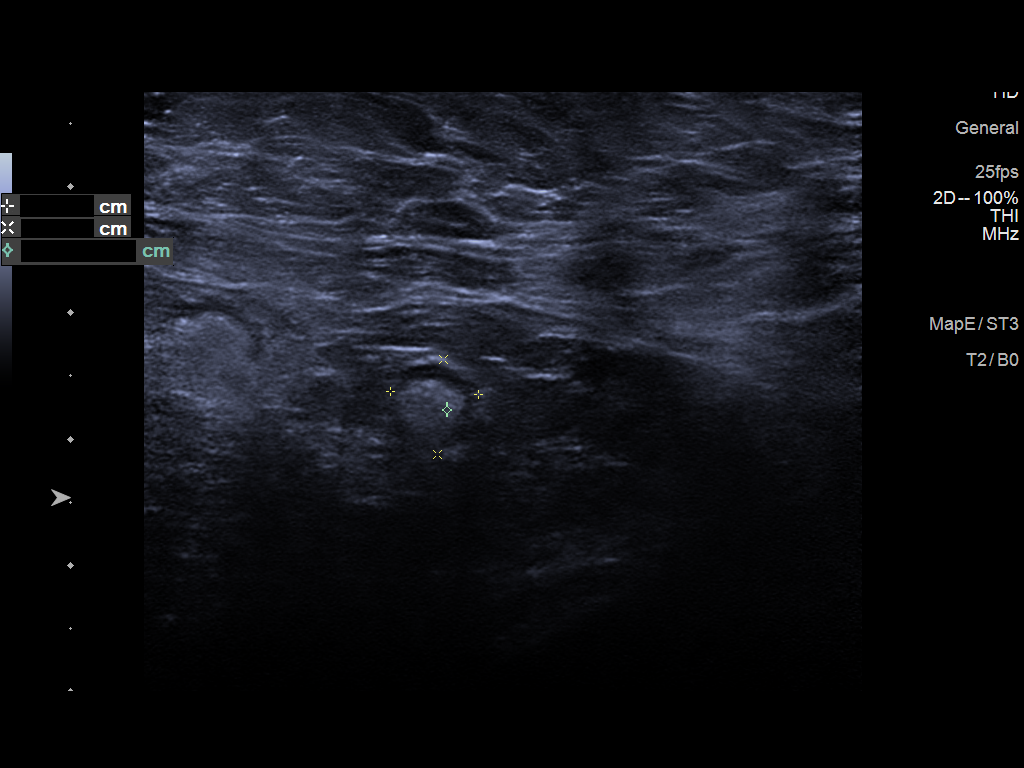
[im 3/6]
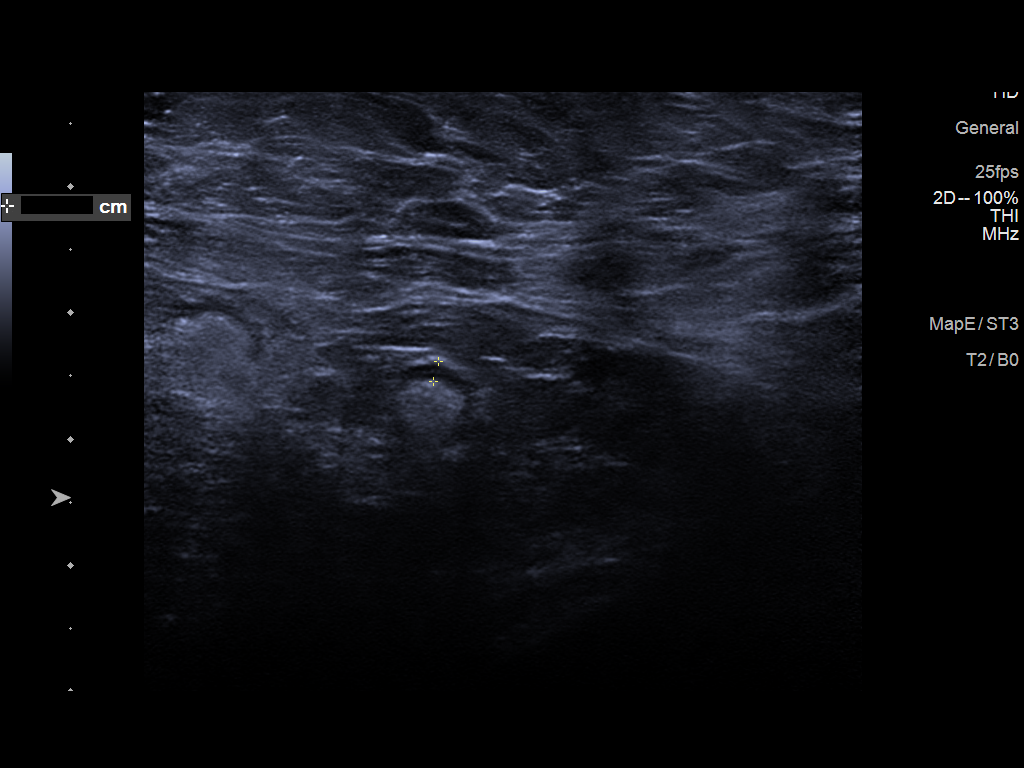
[im 4/6]
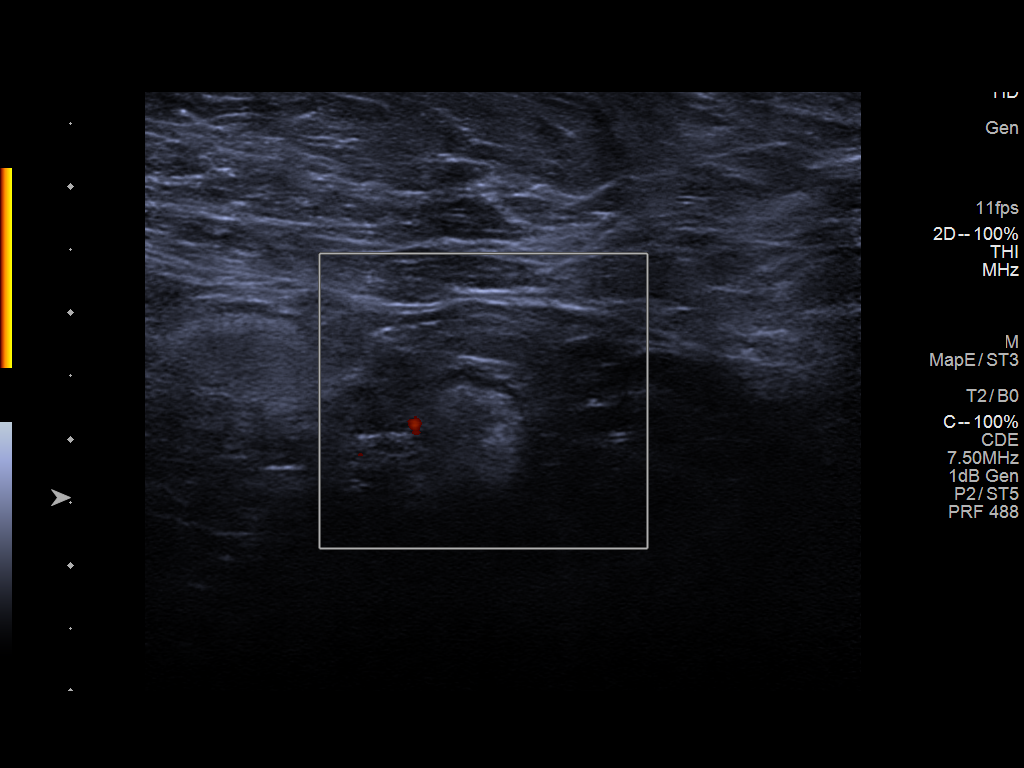
[im 5/6]
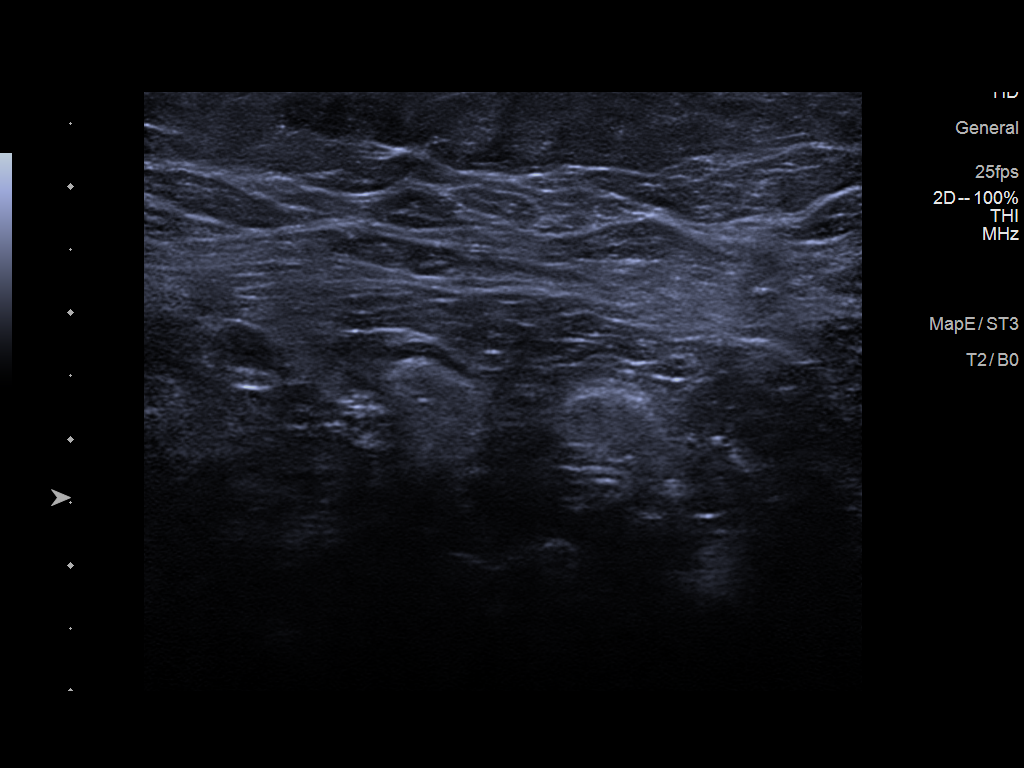
[im 6/6]
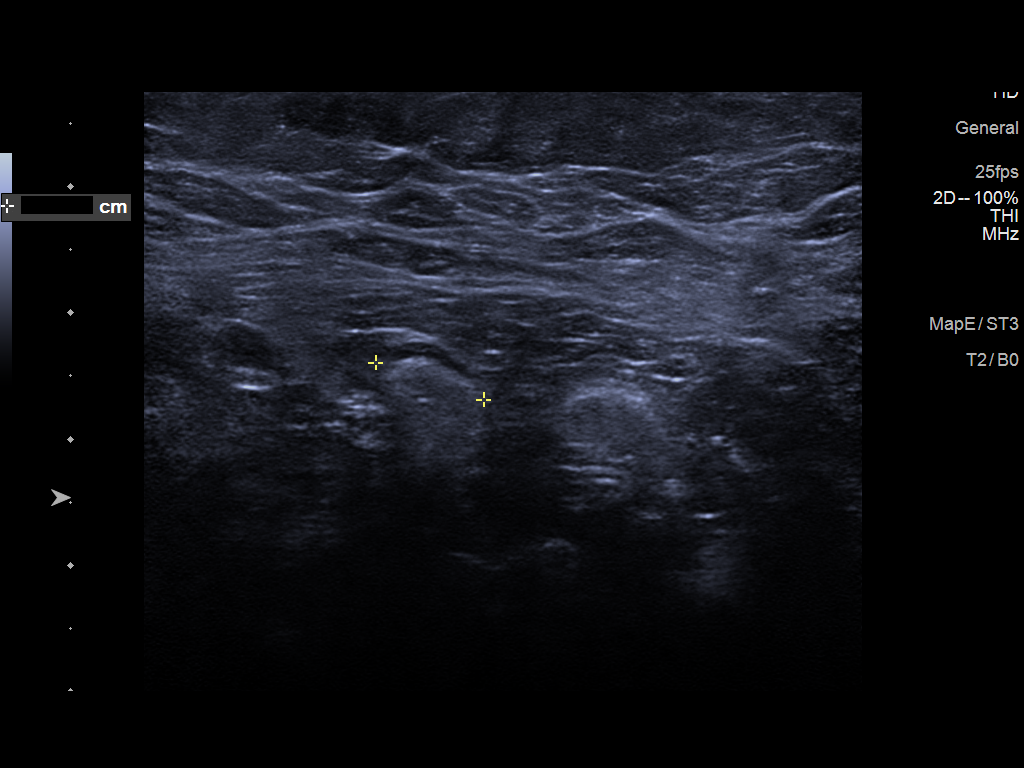

[6 of 6 positions shown; findings below may reference images not displayed]

ACR Breast Density Category b: There are scattered areas of
fibroglandular density.
FINDINGS: Previously described, possible asymmetry in the far posterior right
breast on the MLO projection persists as an oval, circumscribed
mass. The appearance is most suggestive of a low lying axillary
lymph node. Confirmatory ultrasound was performed.

Targeted ultrasound is performed, showing multiple, morphologically
normal lymph nodes along the deep [DATE] axis of the right breast. One
of these likely corresponds with the mammographic finding. No
suspicious findings identified.
IMPRESSION: Morphologically normal, low lying right axillary lymph node
corresponding with the screening mammographic findings. No further
follow-up required.

RECOMMENDATION:
Screening mammogram in one year.(Code:5A-5-6H6)

I have discussed the findings and recommendations with the patient.
If applicable, a reminder letter will be sent to the patient
regarding the next appointment.

BI-RADS CATEGORY  1: Negative.

## 2023-12-21 ENCOUNTER — Other Ambulatory Visit

## 2023-12-26 ENCOUNTER — Ambulatory Visit (INDEPENDENT_AMBULATORY_CARE_PROVIDER_SITE_OTHER): Admitting: Obstetrics & Gynecology

## 2023-12-26 VITALS — BP 143/86 | HR 65 | Ht 69.0 in | Wt 351.4 lb

## 2023-12-26 DIAGNOSIS — R232 Flushing: Secondary | ICD-10-CM | POA: Diagnosis not present

## 2023-12-26 NOTE — Progress Notes (Signed)
    GYNECOLOGY PROGRESS NOTE  Subjective:    Patient ID: Sarah Lozano, female    DOB: December 16, 1970, 53 y.o.   MRN: [id number]  HPI  Patient is a 53 y.o. G92P1001 (31 yo son, 1 grand) here today to discuss HRT. She started on Veozah  08/2023 and is has been a Godsend. However, her insurance co doesn't want to cover this because she has not tried cheaper options. She took OCPs until about 28-48 yo. She still has monthly cycles. She was last sexually active June 2025. This was the first time in about 6 years. She had to use Clomid to achieve pregnancy with her son.   The following portions of the patient's history were reviewed and updated as appropriate: allergies, current medications, past family history, past medical history, past social history, past surgical history, and problem list.  Review of Systems Pertinent items are noted in HPI.   Objective:   Height 5' 9 (1.753 m), weight (!) 351 lb 6.4 oz (159.4 kg), last menstrual period 11/27/2023. Body mass index is 51.89 kg/m. Well nourished, well hydrated White female, no apparent distress She is ambulating and conversing normally.   Assessment:   Hot flashes but still with regular cycles - check FSH when no ERT/veozah  x 7 days (next week) - If pre menopause, restart her favorite OCPs If in menopause, still restart OCPs (understanding that this is off-label use) if she doesn't want to deal with the frustration of arguing with her insurance company. The other option would be traditional HRT.  Plan:   As above

## 2023-12-30 ENCOUNTER — Other Ambulatory Visit

## 2024-01-02 ENCOUNTER — Other Ambulatory Visit

## 2024-01-02 DIAGNOSIS — N951 Menopausal and female climacteric states: Secondary | ICD-10-CM

## 2024-01-03 ENCOUNTER — Other Ambulatory Visit: Payer: Self-pay | Admitting: Obstetrics & Gynecology

## 2024-01-03 ENCOUNTER — Ambulatory Visit: Payer: Self-pay | Admitting: Obstetrics & Gynecology

## 2024-01-03 LAB — FOLLICLE STIMULATING HORMONE: FSH: 3.2 m[IU]/mL

## 2024-01-03 MED ORDER — NORETHINDRONE 0.35 MG PO TABS: 1.0000 | ORAL_TABLET | Freq: Every day | ORAL | 5 refills | Status: AC

## 2024-01-03 NOTE — Telephone Encounter (Signed)
 I called to discuss her choice of OCP. Got VM.

## 2024-01-20 ENCOUNTER — Other Ambulatory Visit

## 2024-02-17 ENCOUNTER — Other Ambulatory Visit

## 2024-03-09 ENCOUNTER — Other Ambulatory Visit

## 2024-03-30 ENCOUNTER — Other Ambulatory Visit

## 2024-04-18 ENCOUNTER — Other Ambulatory Visit

## 2024-04-25 ENCOUNTER — Other Ambulatory Visit

## 2024-05-11 ENCOUNTER — Other Ambulatory Visit

## 2024-06-01 ENCOUNTER — Other Ambulatory Visit
